# Patient Record
Sex: Female | Born: 1980
Health system: Southern US, Community
[De-identification: ages and names within clinical notes are randomized; demographics above are authoritative.]

---

## 2016-01-26 DIAGNOSIS — K2900 Acute gastritis without bleeding: Secondary | ICD-10-CM | POA: Diagnosis not present

## 2016-01-26 DIAGNOSIS — R197 Diarrhea, unspecified: Secondary | ICD-10-CM | POA: Diagnosis not present

## 2016-01-26 DIAGNOSIS — D1803 Hemangioma of intra-abdominal structures: Secondary | ICD-10-CM | POA: Diagnosis not present

## 2016-01-26 DIAGNOSIS — R1013 Epigastric pain: Secondary | ICD-10-CM | POA: Diagnosis not present

## 2016-01-26 DIAGNOSIS — R112 Nausea with vomiting, unspecified: Secondary | ICD-10-CM | POA: Diagnosis not present

## 2016-02-03 DIAGNOSIS — Z0000 Encounter for general adult medical examination without abnormal findings: Secondary | ICD-10-CM | POA: Diagnosis not present

## 2016-02-03 DIAGNOSIS — R002 Palpitations: Secondary | ICD-10-CM | POA: Diagnosis not present

## 2016-02-03 DIAGNOSIS — Z1151 Encounter for screening for human papillomavirus (HPV): Secondary | ICD-10-CM | POA: Diagnosis not present

## 2016-07-05 DIAGNOSIS — Z309 Encounter for contraceptive management, unspecified: Secondary | ICD-10-CM | POA: Diagnosis not present

## 2016-07-05 DIAGNOSIS — Z01419 Encounter for gynecological examination (general) (routine) without abnormal findings: Secondary | ICD-10-CM | POA: Diagnosis not present

## 2016-07-05 DIAGNOSIS — Z0000 Encounter for general adult medical examination without abnormal findings: Secondary | ICD-10-CM | POA: Diagnosis not present

## 2016-07-05 DIAGNOSIS — Z113 Encounter for screening for infections with a predominantly sexual mode of transmission: Secondary | ICD-10-CM | POA: Diagnosis not present

## 2016-08-02 DIAGNOSIS — K080 Exfoliation of teeth due to systemic causes: Secondary | ICD-10-CM | POA: Diagnosis not present

## 2017-05-01 DIAGNOSIS — K080 Exfoliation of teeth due to systemic causes: Secondary | ICD-10-CM | POA: Diagnosis not present

## 2017-05-24 DIAGNOSIS — K080 Exfoliation of teeth due to systemic causes: Secondary | ICD-10-CM | POA: Diagnosis not present

## 2017-06-07 ENCOUNTER — Encounter: Payer: Self-pay | Admitting: Family Medicine

## 2017-06-12 ENCOUNTER — Ambulatory Visit: Payer: Self-pay | Admitting: Family Medicine

## 2017-06-15 ENCOUNTER — Ambulatory Visit (INDEPENDENT_AMBULATORY_CARE_PROVIDER_SITE_OTHER): Payer: Federal, State, Local not specified - PPO | Admitting: Family Medicine

## 2017-06-15 ENCOUNTER — Telehealth: Payer: Self-pay | Admitting: Family Medicine

## 2017-06-15 ENCOUNTER — Encounter: Payer: Self-pay | Admitting: Family Medicine

## 2017-06-15 VITALS — BP 104/69 | HR 60 | Temp 97.8°F | Resp 18 | Ht 64.17 in | Wt 130.6 lb

## 2017-06-15 DIAGNOSIS — Z30019 Encounter for initial prescription of contraceptives, unspecified: Secondary | ICD-10-CM | POA: Diagnosis not present

## 2017-06-15 DIAGNOSIS — L739 Follicular disorder, unspecified: Secondary | ICD-10-CM | POA: Diagnosis not present

## 2017-06-15 DIAGNOSIS — L858 Other specified epidermal thickening: Secondary | ICD-10-CM | POA: Diagnosis not present

## 2017-06-15 LAB — POCT URINE PREGNANCY: Preg Test, Ur: NEGATIVE

## 2017-06-15 MED ORDER — CLINDAMYCIN PHOSPHATE 1 % EX SOLN: Freq: Two times a day (BID) | CUTANEOUS | 5 refills | Status: DC

## 2017-06-15 NOTE — Telephone Encounter (Signed)
Please let patient know that I need to see her again to complete paperwork for her IUD, sorry I was not aware of this process. Please schedule.

## 2017-06-15 NOTE — Patient Instructions (Signed)
     IF you received an x-ray today, you will receive an invoice from [facility]. Please contact [facility] at [phone] with questions or concerns regarding your invoice.   IF you received labwork today, you will receive an invoice from [facility]. Please contact [facility] at [phone] with questions or concerns regarding your invoice.   Our billing staff will not be able to assist you with questions regarding bills from these companies.  You will be contacted with the lab results as soon as they are available. The fastest way to get your results is to activate your My Chart account. Instructions are located on the last page of this paperwork. If you have not heard from us regarding the results in 2 weeks, please contact this office.     

## 2017-06-15 NOTE — Progress Notes (Signed)
9/20/20183:31 PM  Courtney Dudley [birth year], 36 y.o. female [id number]  Chief Complaint  Patient presents with  . Rash    on both arms has been coming and going for a year  . Contraception    HPI:   Patient is a 36 y.o. female with no significant past medical history who presents today for rash on extensor surface of both arms for past year. It started when she moved to [facility]. Was rx topical abx, helped but caused skin dryness, so she stopped. She has recently started applying vitamin E otherwise she has not done anything else for it.  She is also wanting to discuss birth control. In the past got pregnant on the patch. Yaz caused breakthrough bleeding. Has not tried anything else. She has 3 children and does not want anymore at this time. She reports irregular heavy menses. Currently using condoms.  Depression screen PHQ 2/9 06/15/2017  Decreased Interest 0  Down, Depressed, Hopeless 0  PHQ - 2 Score 0    No Known Allergies  No current outpatient prescriptions on file prior to visit.   No current facility-administered medications on file prior to visit.     History reviewed. No pertinent past medical history.  History reviewed. No pertinent surgical history.  Social History  Substance Use Topics  . Smoking status: Never Smoker  . Smokeless tobacco: Never Used  . Alcohol use Not on file    History reviewed. No pertinent family history.  Review of Systems  Constitutional: Negative for chills and fever.  Respiratory: Negative for cough and shortness of breath.   Cardiovascular: Negative for chest pain, palpitations and leg swelling.  Gastrointestinal: Negative for abdominal pain, nausea and vomiting.  Skin: Positive for rash. Negative for itching.     OBJECTIVE:  Blood pressure 104/69, pulse 60, temperature 97.8 F (36.6 C), temperature source Oral, resp. rate 18, height 5' 4.17" (1.63 m), weight 130 lb 9.6 oz (59.2 kg), last menstrual period 05/15/2017, SpO2 98  %.  Physical Exam  Constitutional: She is oriented to person, place, and time and well-developed, well-nourished, and in no distress.  HENT:  Head: Normocephalic and atraumatic.  Mouth/Throat: Oropharynx is clear and moist. No oropharyngeal exudate.  Eyes: Pupils are equal, round, and reactive to light. EOM are normal. No scleral icterus.  Neck: Neck supple.  Cardiovascular: Normal rate, regular rhythm and normal heart sounds.  Exam reveals no gallop and no friction rub.   No murmur heard. Pulmonary/Chest: Effort normal and breath sounds normal. She has no wheezes. She has no rales.  Musculoskeletal: She exhibits no edema.  Neurological: She is alert and oriented to person, place, and time. Gait normal.  Skin: Skin is warm and dry. Rash (extensor surface of arms with scattered hyperpigmentation, rough bumps, some follicles with pustules) noted.    Results for orders placed or performed in visit on 06/15/17  POCT urine pregnancy  Result Value Ref Range   Preg Test, Ur Negative Negative     ASSESSMENT and PLAN:  1. Encounter for initial prescription of contraceptives, unspecified contraceptive Discussed birth control options with focus on IUD given current irregular heavy menses. Provided patient educational handouts. - POCT urine pregnancy  2. Folliculitis Discussed skin findings, reassured benign and non contagious. Also focused on management vs resolution of skin. Discussed conservative measures, rx for topical clindamycin  3. Keratosis pilaris See above. Advised use of amlactin or similar. Patient educational handout given.        Myles Lipps, MD  Primary Care at [place], [place] [address] Ph.  [phone] Fax [phone]

## 2017-06-16 ENCOUNTER — Telehealth: Payer: Self-pay | Admitting: Family Medicine

## 2017-06-16 NOTE — Telephone Encounter (Signed)
Spoke with patient advised her to return back to clinic for an OV to finish her paperwork for an IUD

## 2017-06-22 ENCOUNTER — Encounter: Payer: Federal, State, Local not specified - PPO | Admitting: Family Medicine

## 2017-08-01 ENCOUNTER — Encounter: Payer: Self-pay | Admitting: Family Medicine

## 2017-08-01 ENCOUNTER — Ambulatory Visit (INDEPENDENT_AMBULATORY_CARE_PROVIDER_SITE_OTHER): Payer: Federal, State, Local not specified - PPO | Admitting: Family Medicine

## 2017-08-01 ENCOUNTER — Other Ambulatory Visit: Payer: Self-pay | Admitting: Family Medicine

## 2017-08-01 VITALS — BP 100/60 | HR 80 | Temp 97.6°F | Resp 16 | Ht 65.5 in | Wt 126.4 lb

## 2017-08-01 DIAGNOSIS — Z30014 Encounter for initial prescription of intrauterine contraceptive device: Secondary | ICD-10-CM | POA: Diagnosis not present

## 2017-08-01 DIAGNOSIS — Z01419 Encounter for gynecological examination (general) (routine) without abnormal findings: Secondary | ICD-10-CM

## 2017-08-01 DIAGNOSIS — Z23 Encounter for immunization: Secondary | ICD-10-CM

## 2017-08-01 NOTE — Patient Instructions (Signed)
1. Buy an over the counter glucometer kit with about 10 strips, check glucose level when feeling symptoms of hunger and weakness, note when you last ate and what it was.

## 2017-08-01 NOTE — Progress Notes (Signed)
11/6/20185:25 PM  Courtney Dudley [birth year], 36 y.o. female [id number]  Chief Complaint  Patient presents with  . Annual Exam    with pap    HPI:   Patient is a 36 y.o. female with no significant past medical history who presents today for CPE.  Patient is a [id number] LMP 07/23/17, irregular, heavy menses Last visit we dicussed BC options, she would like to pursue Mirena IUD She otherwise states that a topical KP lotion has been working well She reports recent episodes of feeling very weak and hungry, she is worried as there a strong fhx diabetes. She eats very healthy, lean protein, nuts, does eat rice every day but no other significant source of carbs. She eats dinner at 430pm, tends to happen around 6pm.  She is fasting today Denies any weight loss, polydipsia, polyuria.  Depression screen [facility] 2/9 08/01/2017 06/15/2017  Decreased Interest 0 0  Down, Depressed, Hopeless 0 0  PHQ - 2 Score 0 0    No Known Allergies  Prior to Admission medications   Medication Sig Start Date End Date Taking? Authorizing Provider    History reviewed. No pertinent past medical history.  History reviewed. No pertinent surgical history.  Social History   Tobacco Use  . Smoking status: Never Smoker  . Smokeless tobacco: Never Used  Substance Use Topics  . Alcohol use: No    Frequency: Never    History reviewed. No pertinent family history.  Review of Systems  Constitutional: Negative for chills and fever.  Respiratory: Negative for cough and shortness of breath.   Cardiovascular: Negative for chest pain, palpitations and leg swelling.  Gastrointestinal: Negative for abdominal pain, constipation, diarrhea, nausea and vomiting.  Genitourinary:       Neg breast lumps or nipple discharge Neg vaginal discharge, pelvic pain, dyspareunia, abnormal vaginal bleeding  Musculoskeletal: Negative for joint pain and myalgias.  Neurological: Positive for dizziness, weakness and headaches.    Endo/Heme/Allergies: Negative for polydipsia.   OBJECTIVE:  Blood pressure 100/60, pulse 80, temperature 97.6 F (36.4 C), temperature source Oral, resp. rate 16, height 5' 5.5" (1.664 m), weight 126 lb 6.4 oz (57.3 kg), last menstrual period 07/23/2017, SpO2 98 %.  Physical Exam  Constitutional: She is oriented to person, place, and time and well-developed, well-nourished, and in no distress.  HENT:  Head: Normocephalic and atraumatic.  Right Ear: Hearing, tympanic membrane, external ear and ear canal normal.  Left Ear: Hearing, tympanic membrane, external ear and ear canal normal.  Mouth/Throat: Oropharynx is clear and moist.  Eyes: EOM are normal. Pupils are equal, round, and reactive to light.  Neck: Neck supple. No thyromegaly present.  Cardiovascular: Normal rate, regular rhythm, normal heart sounds and intact distal pulses. Exam reveals no gallop and no friction rub.  No murmur heard. Pulmonary/Chest: Effort normal and breath sounds normal. She has no wheezes. She has no rales. Right breast exhibits no inverted nipple, no mass, no nipple discharge, no skin change and no tenderness. Left breast exhibits no inverted nipple, no mass, no nipple discharge, no skin change and no tenderness.  Abdominal: Soft. Bowel sounds are normal. She exhibits no distension and no mass. There is no tenderness.  Genitourinary: Vagina normal, uterus normal, right adnexa normal, left adnexa normal and vulva normal.  Musculoskeletal: Normal range of motion. She exhibits no edema.  Lymphadenopathy:    She has no cervical adenopathy.    She has no axillary adenopathy.       Right: No supraclavicular adenopathy  present.       Left: No supraclavicular adenopathy present.  Neurological: She is alert and oriented to person, place, and time. She has normal reflexes. Gait normal.  Skin: Skin is warm and dry.  Psychiatric: Mood and affect normal.  Nursing note and vitals reviewed.   ASSESSMENT and PLAN  1.  Encounter for annual routine gynecological examination  Routine HCM labs ordered. HCM reviewed/discussed. Anticipatory guidance regarding healthy weight, lifestyle and choices given.   Discussed spacing of meals, protein and fiber to help stabilize, asked that patient check cbgs at home with OTC glucometer when having sx.   - CBC - Lipid panel - TSH - CMP14+EGFR - Pap IG w/ reflex to HPV when ASC-U  2. Need for prophylactic vaccination and inoculation against influenza - Flu Vaccine QUAD 36+ mos IM - given today - Tdap vaccine greater than or equal to 7yo IM - given today  3. Encounter for initial prescription of intrauterine contraceptive device (IUD) R/S/B discussed. Paperwork to order [facility] from [facility] completed today. Patient to return while on next menses for insertion.     Return in about 4 weeks (around 08/29/2017) for IUD placement, fu possible hypoglycemia.    Rutherford Guys, MD Primary Care at [facility] [place], [place] [address] Ph.  [phone] Fax [phone]

## 2017-08-02 LAB — CMP14+EGFR
ALT: 12 IU/L (ref 0–32)
AST: 16 IU/L (ref 0–40)
Albumin/Globulin Ratio: 2.2 (ref 1.2–2.2)
Albumin: 4.7 g/dL (ref 3.5–5.5)
Alkaline Phosphatase: 39 IU/L (ref 39–117)
BUN/Creatinine Ratio: 13 (ref 9–23)
BUN: 11 mg/dL (ref 6–20)
Bilirubin Total: 0.4 mg/dL (ref 0.0–1.2)
CO2: 25 mmol/L (ref 20–29)
Calcium: 9.6 mg/dL (ref 8.7–10.2)
Chloride: 101 mmol/L (ref 96–106)
Creatinine, Ser: 0.83 mg/dL (ref 0.57–1.00)
GFR calc Af Amer: 105 mL/min/{1.73_m2} (ref >59)
GFR calc non Af Amer: 91 mL/min/{1.73_m2} (ref >59)
Globulin, Total: 2.1 g/dL (ref 1.5–4.5)
Glucose: 84 mg/dL (ref 65–99)
Potassium: 4.5 mmol/L (ref 3.5–5.2)
Sodium: 139 mmol/L (ref 134–144)
Total Protein: 6.8 g/dL (ref 6.0–8.5)

## 2017-08-02 LAB — PAP IG W/ RFLX HPV ASCU: PAP Smear Comment: 0

## 2017-08-02 LAB — CBC
Hematocrit: 42.8 % (ref 34.0–46.6)
Hemoglobin: 13.9 g/dL (ref 11.1–15.9)
MCH: 30.4 pg (ref 26.6–33.0)
MCHC: 32.5 g/dL (ref 31.5–35.7)
MCV: 94 fL (ref 79–97)
Platelets: 242 10*3/uL (ref 150–379)
RBC: 4.57 x10E6/uL (ref 3.77–5.28)
RDW: 14.1 % (ref 12.3–15.4)
WBC: 4.9 10*3/uL (ref 3.4–10.8)

## 2017-08-02 LAB — LIPID PANEL
Chol/HDL Ratio: 2.9 ratio (ref 0.0–4.4)
Cholesterol, Total: 238 mg/dL — ABNORMAL HIGH (ref 100–199)
HDL: 81 mg/dL (ref >39)
LDL Calculated: 148 mg/dL — ABNORMAL HIGH (ref 0–99)
Triglycerides: 43 mg/dL (ref 0–149)
VLDL Cholesterol Cal: 9 mg/dL (ref 5–40)

## 2017-08-02 LAB — TSH: TSH: 1.58 u[IU]/mL (ref 0.450–4.500)

## 2017-08-02 LAB — SPECIMEN STATUS REPORT

## 2017-08-04 ENCOUNTER — Encounter: Payer: Self-pay | Admitting: Family Medicine

## 2017-08-09 ENCOUNTER — Telehealth: Payer: Self-pay | Admitting: Family Medicine

## 2017-08-09 NOTE — Telephone Encounter (Signed)
Pt calling to get results from labs done a week ago. Notified pt that the results had not been released for the [facility] triage nurse to disclose and that she would be notified by the office when results are ready.

## 2017-08-24 ENCOUNTER — Telehealth: Payer: Self-pay | Admitting: Family Medicine

## 2017-08-24 NOTE — Telephone Encounter (Signed)
See routing message

## 2017-08-25 NOTE — Telephone Encounter (Signed)
I do not have this form.  Please advise [facility] to resend

## 2017-08-25 NOTE — Telephone Encounter (Signed)
[facility] He states fax requesting IUD was never received, requesting fax be re-sent to [phone].

## 2017-09-08 ENCOUNTER — Encounter: Payer: Self-pay | Admitting: Family Medicine

## 2017-09-08 ENCOUNTER — Ambulatory Visit: Payer: Federal, State, Local not specified - PPO | Admitting: Family Medicine

## 2017-09-08 ENCOUNTER — Other Ambulatory Visit: Payer: Self-pay

## 2017-09-08 VITALS — BP 120/76 | HR 71 | Temp 97.7°F | Resp 18 | Ht 64.65 in | Wt 128.8 lb

## 2017-09-08 DIAGNOSIS — N926 Irregular menstruation, unspecified: Secondary | ICD-10-CM | POA: Diagnosis not present

## 2017-09-08 DIAGNOSIS — M2570 Osteophyte, unspecified joint: Secondary | ICD-10-CM

## 2017-09-08 LAB — POCT URINE PREGNANCY: Preg Test, Ur: NEGATIVE

## 2017-09-08 NOTE — Progress Notes (Signed)
   12/14/20184:33 PM  Courtney Dudley 04/09/1981, 36 y.o. female [id number]  Chief Complaint  Patient presents with  . Mass    X 1 day- knot/bump on back of head     HPI:   Patient is a 36 y.o. female who presents today for she felt a bump yesterday when combing her hair. She states that it is not painful, she has never noticed it before, she denies any trauma to area.   Also requesting to check for pregnancy as she is late menses wise. Still pending arrival of mirena IUD, declines any transitioning form of contraception.  [facility] 09/08/2017 08/01/2017 06/15/2017  Decreased Interest 0 0 0  Down, Depressed, Hopeless 0 0 0  PHQ - 2 Score 0 0 0    No Known Allergies  Prior to Admission medications   Medication Sig Start Date End Date Taking? Authorizing Provider  clindamycin (CLEOCIN-T) 1 % external solution Apply topically 2 (two) times daily. 06/15/17  Yes Myles LippsSantiago, Irma M, MD    History reviewed. No pertinent past medical history.  History reviewed. No pertinent surgical history.  Social History   Tobacco Use  . Smoking status: Never Smoker  . Smokeless tobacco: Never Used  Substance Use Topics  . Alcohol use: No    Frequency: Never    History reviewed. No pertinent family history.  Review of Systems  Constitutional: Negative for chills, diaphoresis, fever, malaise/fatigue and weight loss.  HENT: Negative for hearing loss and tinnitus.   Eyes: Negative for blurred vision and double vision.  Neurological: Positive for dizziness (had vertigo several weeks ago, to the left, resolved). Negative for headaches.     OBJECTIVE:  Blood pressure 120/76, pulse 71, temperature 97.7 F (36.5 C), temperature source Oral, resp. rate 18, height 5' 4.65" (1.642 m), weight 128 lb 12.8 oz (58.4 kg), last menstrual period 07/28/2017, SpO2 98 %.   Physical Exam  Constitutional: She is oriented to person, place, and time and well-developed, well-nourished, and in  no distress.  HENT:  Head: Normocephalic and atraumatic.    Mouth/Throat: Mucous membranes are normal.  Eyes: EOM are normal. Pupils are equal, round, and reactive to light. No scleral icterus.  Neck: Neck supple.  Pulmonary/Chest: Effort normal.  Neurological: She is alert and oriented to person, place, and time. Gait normal.  Skin: Skin is warm and dry.  Psychiatric: Mood and affect normal.  Nursing note and vitals reviewed.      Results for orders placed or performed in visit on 09/08/17 (from the past 24 hour(s))  POCT urine pregnancy     Status: None   Collection Time: 09/08/17  4:25 PM  Result Value Ref Range   Preg Test, Ur Negative Negative    No results found.   ASSESSMENT and PLAN  1. Missed period Paperwork for Mirena IUD refaxed today. Declines bridge method.  - POCT urine pregnancy  2. Bone callus Non concerning exam. Will continue to monitor.   Return in about 3 weeks (around 09/29/2017) for IUD.    Myles LippsIrma M Santiago, MD Primary [facility] at [facility] [address] [place], [place] [id number] Ph.  [phone] Fax [phone]

## 2017-09-08 NOTE — Patient Instructions (Signed)
     IF you received an x-ray today, you will receive an invoice from [facility]. Please contact [facility] at [phone] with questions or concerns regarding your invoice.   IF you received labwork today, you will receive an invoice from [facility]. Please contact [facility] at [phone] with questions or concerns regarding your invoice.   Our billing staff will not be able to assist you with questions regarding bills from these companies.  You will be contacted with the lab results as soon as they are available. The fastest way to get your results is to activate your My Chart account. Instructions are located on the last page of this paperwork. If you have not heard from us regarding the results in 2 weeks, please contact this office.     

## 2017-09-09 ENCOUNTER — Ambulatory Visit: Payer: Federal, State, Local not specified - PPO | Admitting: Family Medicine

## 2017-10-06 ENCOUNTER — Telehealth: Payer: Self-pay

## 2017-10-06 NOTE — Telephone Encounter (Signed)
Order for IUD was made 10/06/17 Pharmacy - [facility] [phone]

## 2018-01-05 ENCOUNTER — Telehealth: Payer: Self-pay

## 2018-01-05 NOTE — Telephone Encounter (Signed)
L/m for pt to update on status of Mirena.  Have not received it from supplier yet.  If she is still interested, we could confirm with insurance and order it from another supplier but need to confirm if she still wants it.  Requested c/b.

## 2018-01-08 DIAGNOSIS — K080 Exfoliation of teeth due to systemic causes: Secondary | ICD-10-CM | POA: Diagnosis not present

## 2018-02-07 DIAGNOSIS — K080 Exfoliation of teeth due to systemic causes: Secondary | ICD-10-CM | POA: Diagnosis not present

## 2018-02-26 DIAGNOSIS — Z0000 Encounter for general adult medical examination without abnormal findings: Secondary | ICD-10-CM | POA: Diagnosis not present

## 2018-02-26 DIAGNOSIS — E7800 Pure hypercholesterolemia, unspecified: Secondary | ICD-10-CM | POA: Diagnosis not present

## 2018-02-26 DIAGNOSIS — L309 Dermatitis, unspecified: Secondary | ICD-10-CM | POA: Diagnosis not present

## 2018-03-06 DIAGNOSIS — E7800 Pure hypercholesterolemia, unspecified: Secondary | ICD-10-CM | POA: Diagnosis not present

## 2018-11-27 DIAGNOSIS — K080 Exfoliation of teeth due to systemic causes: Secondary | ICD-10-CM | POA: Diagnosis not present

## 2019-05-15 DIAGNOSIS — M79604 Pain in right leg: Secondary | ICD-10-CM | POA: Diagnosis not present

## 2019-05-15 DIAGNOSIS — M79605 Pain in left leg: Secondary | ICD-10-CM | POA: Diagnosis not present

## 2019-08-19 DIAGNOSIS — K080 Exfoliation of teeth due to systemic causes: Secondary | ICD-10-CM | POA: Diagnosis not present

## 2019-10-20 DIAGNOSIS — Z20828 Contact with and (suspected) exposure to other viral communicable diseases: Secondary | ICD-10-CM | POA: Diagnosis not present

## 2019-10-20 DIAGNOSIS — Z20822 Contact with and (suspected) exposure to covid-19: Secondary | ICD-10-CM | POA: Diagnosis not present

## 2020-01-20 ENCOUNTER — Emergency Department (HOSPITAL_COMMUNITY)
Admission: EM | Admit: 2020-01-20 | Discharge: 2020-01-20 | Disposition: A | Discharge status: Home / Self Care | Payer: Federal, State, Local not specified - PPO | Attending: Emergency Medicine | Admitting: Emergency Medicine

## 2020-01-20 ENCOUNTER — Other Ambulatory Visit: Payer: Self-pay

## 2020-01-20 ENCOUNTER — Emergency Department (HOSPITAL_COMMUNITY): Payer: Federal, State, Local not specified - PPO

## 2020-01-20 DIAGNOSIS — H81399 Other peripheral vertigo, unspecified ear: Secondary | ICD-10-CM | POA: Diagnosis not present

## 2020-01-20 DIAGNOSIS — Z79899 Other long term (current) drug therapy: Secondary | ICD-10-CM | POA: Insufficient documentation

## 2020-01-20 DIAGNOSIS — R42 Dizziness and giddiness: Secondary | ICD-10-CM | POA: Diagnosis not present

## 2020-01-20 LAB — I-STAT BETA HCG BLOOD, ED (MC, WL, AP ONLY): I-stat hCG, quantitative: <5 m[IU]/mL (ref <5)

## 2020-01-20 LAB — BASIC METABOLIC PANEL
Anion gap: 7 (ref 5–15)
BUN: 9 mg/dL (ref 6–20)
CO2: 26 mmol/L (ref 22–32)
Calcium: 8.8 mg/dL — ABNORMAL LOW (ref 8.9–10.3)
Chloride: 107 mmol/L (ref 98–111)
Creatinine, Ser: 0.75 mg/dL (ref 0.44–1.00)
GFR calc Af Amer: >60 mL/min (ref >60)
GFR calc non Af Amer: >60 mL/min (ref >60)
Glucose, Bld: 96 mg/dL (ref 70–99)
Potassium: 3.6 mmol/L (ref 3.5–5.1)
Sodium: 140 mmol/L (ref 135–145)

## 2020-01-20 LAB — CBC
HCT: 37.9 % (ref 36.0–46.0)
Hemoglobin: 12.4 g/dL (ref 12.0–15.0)
MCH: 30.2 pg (ref 26.0–34.0)
MCHC: 32.7 g/dL (ref 30.0–36.0)
MCV: 92.4 fL (ref 80.0–100.0)
Platelets: 172 10*3/uL (ref 150–400)
RBC: 4.1 MIL/uL (ref 3.87–5.11)
RDW: 12.7 % (ref 11.5–15.5)
WBC: 4.1 10*3/uL (ref 4.0–10.5)
nRBC: 0 % (ref 0.0–0.2)

## 2020-01-20 LAB — CBG MONITORING, ED: Glucose-Capillary: 81 mg/dL (ref 70–99)

## 2020-01-20 MED ORDER — DIAZEPAM 2 MG PO TABS
2.0000 mg | ORAL_TABLET | Freq: Once | ORAL | Status: AC
  Administered 2020-01-20: 2 mg via ORAL
  Filled 2020-01-20: qty 1

## 2020-01-20 MED ORDER — SODIUM CHLORIDE 0.9% FLUSH: 3.0000 mL | Freq: Once | INTRAVENOUS | Status: DC

## 2020-01-20 MED ORDER — MECLIZINE HCL 25 MG PO TABS
25.0000 mg | ORAL_TABLET | Freq: Once | ORAL | Status: AC
  Administered 2020-01-20: 25 mg via ORAL
  Filled 2020-01-20: qty 1

## 2020-01-20 MED ORDER — MECLIZINE HCL 25 MG PO TABS
25.0000 mg | ORAL_TABLET | Freq: Three times a day (TID) | ORAL | 0 refills | Status: DC | PRN
Start: 2020-01-20 — End: 2020-05-13

## 2020-01-20 NOTE — ED Provider Notes (Signed)
[facility] EMERGENCY DEPARTMENT Provider Note   CSN: [id number] Arrival date & time: 01/20/20  9381     History Chief Complaint  Patient presents with  . Dizziness    Courtney Dudley is a 39 y.o. female history of migraines otherwise healthy.  Patient reports that on Saturday morning 01/18/2020 she was sleeping on her right side when she woke up, she then turned onto her back and experienced dizziness which she describes as room spinning, this lasted several seconds, she attempted to sit up straight in bed became lightheaded and had to lay back down, she denies loss of consciousness.  She reports that she has had intermittent room spinning sensation since that time particularly whenever she is laying down and turns her head to the side, this occurs less often when she is already standing up.  Associated symptom headache, she reports that she has a history of menstrual headaches.  She reports that on Saturday morning she had a mild headache which has since resolved she described as a bilateral throbbing sensation nonradiating consistent with her normal menstrual headache.  Denies recent illness, fever/chills, vision changes, difficulty speaking, sore throat, neck stiffness, chest pain/shortness of breath, cough/hemoptysis, abdominal pain, vomiting/diarrhea, dysuria, numbness/weakness, tingling or any additional concerns.  Additionally denies any recent chiropractic adjustments or potential maneuvers that could lead to injury of the cervical spine.  HPI     No past medical history on file.  There are no problems to display for this patient.   No past surgical history on file.   OB History   No obstetric history on file.     No family history on file.  Social History   Tobacco Use  . Smoking status: Never Smoker  . Smokeless tobacco: Never Used  Substance Use Topics  . Alcohol use: No  . Drug use: No    Home Medications Prior to Admission medications    Medication Sig Start Date End Date Taking? Authorizing Provider  clindamycin (CLEOCIN-T) 1 % external solution Apply topically 2 (two) times daily. 06/15/17   Rutherford Guys, MD  meclizine (ANTIVERT) 25 MG tablet Take 1 tablet (25 mg total) by mouth 3 (three) times daily as needed for dizziness. 01/20/20   Deliah Boston, PA-C    Allergies    Patient has no known allergies.  Review of Systems   Review of Systems Ten systems are reviewed and are negative for acute change except as noted in the HPI  Physical Exam Updated Vital Signs BP 124/75 (BP Location: Left Arm)   Pulse (!) 56   Temp 98 F (36.7 C) (Oral)   Resp 16   LMP 01/20/2020   SpO2 100%   Physical Exam Constitutional:      General: She is not in acute distress.    Appearance: Normal appearance. She is well-developed. She is not ill-appearing or diaphoretic.  HENT:     Head: Normocephalic and atraumatic.     Jaw: There is normal jaw occlusion.     Right Ear: External ear normal. No hemotympanum.     Left Ear: External ear normal. No hemotympanum.     Ears:     Comments: Tympanostomy scars    Nose: Nose normal.     Mouth/Throat:     Mouth: Mucous membranes are moist.     Pharynx: Oropharynx is clear.  Eyes:     General: Vision grossly intact. Gaze aligned appropriately.     Conjunctiva/sclera: Conjunctivae normal.  Pupils: Pupils are equal, round, and reactive to light.     Comments: No pain with EOM Visual fields grossly intact bilaterally  Neck:     Trachea: Trachea and phonation normal. No tracheal tenderness or tracheal deviation.     Meningeal: Brudzinski's sign absent.  Pulmonary:     Effort: Pulmonary effort is normal. No respiratory distress.  Abdominal:     General: There is no distension.     Palpations: Abdomen is soft.     Tenderness: There is no abdominal tenderness. There is no guarding or rebound.  Musculoskeletal:        General: Normal range of motion.     Cervical back: Full  passive range of motion without pain and normal range of motion. No spinous process tenderness or muscular tenderness.  Skin:    General: Skin is warm and dry.  Neurological:     Mental Status: She is alert.     GCS: GCS eye subscore is 4. GCS verbal subscore is 5. GCS motor subscore is 6.     Comments: Mental Status: Alert, oriented, thought content appropriate, able to give a coherent history. Speech fluent without evidence of aphasia. Able to follow 2 step commands without difficulty. Cranial Nerves: II: Peripheral visual fields grossly normal, pupils equal, round, reactive to light III,IV, VI: ptosis not present, extra-ocular motions intact bilaterally V,VII: smile symmetric, eyebrows raise symmetric, facial light touch sensation equal VIII: hearing grossly normal to voice X: uvula elevates symmetrically XI: bilateral shoulder shrug symmetric and strong XII: midline tongue extension without fassiculations Motor: Normal tone. 5/5 strength in upper and lower extremities bilaterally including strong and equal grip strength and dorsiflexion/plantar flexion Sensory: Sensation intact to light touch in all extremities.Negative Romberg.  Deep Tendon Reflexes: 2+ patellas Cerebellar: normal finger-to-nose with bilateral upper extremities. Normal heel-to -shin balance bilaterally of the lower extremity. No pronator drift.  Gait: normal gait and balance CV: distal pulses palpable throughout -----      Head impulse test: During a head impulse test, no clear fixation however patient endorsed return of dizziness while turning head.  Nystagmus: unidirectional, horizontal left-beating nystagmus  Skew deviation: grossly absent  Psychiatric:        Behavior: Behavior normal.    ED Results / Procedures / Treatments   Labs (all labs ordered are listed, but only abnormal results are displayed) Labs Reviewed  BASIC METABOLIC PANEL - Abnormal; Notable for the following components:       Result Value   Calcium 8.8 (*)    All other components within normal limits  CBC  URINALYSIS, ROUTINE W REFLEX MICROSCOPIC  CBG MONITORING, ED  I-STAT BETA HCG BLOOD, ED ([facility], [facility], [facility] ONLY)    EKG EKG Interpretation  Date/Time:  Monday January 20 2020 08:31:33 EDT Ventricular Rate:  62 PR Interval:  184 QRS Duration: 88 QT Interval:  416 QTC Calculation: 422 R Axis:   78 Text Interpretation: Normal sinus rhythm Normal ECG no prior available for comparison Confirmed by Tilden Fossa [phone]) on 01/20/2020 12:57:30 PM   Radiology CT Head Wo Contrast  Result Date: 01/20/2020 CLINICAL DATA:  Dizziness and presyncope EXAM: CT HEAD WITHOUT CONTRAST TECHNIQUE: Contiguous axial images were obtained from the base of the skull through the vertex without intravenous contrast. COMPARISON:  None. FINDINGS: Brain: Ventricles and sulci are normal in size and configuration. There is no intracranial mass, hemorrhage, extra-axial fluid collection, or midline shift. The brain parenchyma appears unremarkable. No evident acute infarct. Vascular: Hyperdense vessel.  No evident vascular calcification. Skull: The bony calvarium appears intact. Sinuses/Orbits: Visualized paranasal sinuses are clear. Orbits appear symmetric bilaterally. Other: Mastoid air cells are clear. IMPRESSION: Study within normal limits. Electronically Signed   By: Bretta Bang III M.D.   On: 01/20/2020 14:41    Procedures Procedures (including critical care time)  Medications Ordered in ED Medications  sodium chloride flush (NS) 0.9 % injection 3 mL (3 mLs Intravenous Not Given 01/20/20 1320)  meclizine (ANTIVERT) tablet 25 mg (25 mg Oral Given 01/20/20 1319)  diazepam (VALIUM) tablet 2 mg (2 mg Oral Given 01/20/20 1420)    ED Course  I have reviewed the triage vital signs and the nursing notes.  Pertinent labs & imaging results that were available during my care of the patient were reviewed by me and considered in my medical  decision making (see chart for details).    MDM Rules/Calculators/A&P                     39 year old female presents today with dizziness most consistent with a peripheral cause, likely vertigo. Differential diagnoses includes: BPPV versus labrynthitis.No red flag features for central vertigo to include gradual onset, vertical/bidirectional or nonfatigable nystagmus, focal neurologic abnormalities. Presentation not consistent with an acute CNS infection, vertebral basilar artery insufficiency, dissection, cerebellar hemorrhage or infarction, intracranial mass or bleed, temporal lobe epilepsy, multiple sclerosis, trauma, complex migraine headache. Other acute, emergent causes of vertigo are unlikely given at this time.  Plan of care is meclizine and reassess, patient agreeable to plan. - Patient was given meclizine reports improvement in dizziness but dizziness returns with head movement.  Additionally patient has become very anxious, she was looking at symptoms on her phone and is very concerned of a possible intracranial process.  I had a shared decision-making discussion with the patient, feel patient would benefit from CT head today for assessment of intracranial abnormalities, additionally will give 2 mg p.o. Valium for still likely peripheral vertigo.  Discussed radiation exposures from CT scan patient stated understanding and seemed risks and wishes to proceed with CT scan. = CT Head:  IMPRESSION:  Study within normal limits.   I have personally reviewed patient's CT head, agree with radiology interpretation.  I see no evidence of obvious intracranial hemorrhage or mass-effect. - 3:00 PM: Patient reassessed she is resting comfortably no acute distress reports improvement of dizziness and wishes to go home.  She is updated on findings as above and states understanding.  She will be prescribed meclizine for likely peripheral vertigo and referred to ENT for follow-up delete as well as PCP.   Patient eating [place] sandwich without difficulty.  At this time there does not appear to be any evidence of an acute emergency medical condition and the patient appears stable for discharge with appropriate outpatient follow up. Diagnosis was discussed with patient who verbalizes understanding of care plan and is agreeable to discharge. I have discussed return precautions with patient  who verbalizes understanding. Patient encouraged to follow-up with their PCP and ENT. All questions answered.  Patient's case discussed with Dr. Madilyn Hook who agrees with plan to discharge with follow-up.   Note: Portions of this report may have been transcribed using voice recognition software. Every effort was made to ensure accuracy; however, inadvertent computerized transcription errors may still be present. Final Clinical Impression(s) / ED Diagnoses Final diagnoses:  Dizziness  Peripheral vertigo, unspecified laterality    Rx / DC Orders ED Discharge Orders  Ordered    meclizine (ANTIVERT) 25 MG tablet  3 times daily PRN     01/20/20 1505           Elizabeth Palau 01/20/20 1507    Tilden Fossa, MD 01/20/20 1526

## 2020-01-20 NOTE — Discharge Instructions (Addendum)
You have been diagnosed today with Dizziness, Peripheral Vertigo.  At this time there does not appear to be the presence of an emergent medical condition, however there is always the potential for conditions to change. Please read and follow the below instructions.  Please return to the Emergency Department immediately for any new or worsening symptoms. Please be sure to follow up with your Primary Care Provider within one week regarding your visit today; please call their office to schedule an appointment even if you are feeling better for a follow-up visit. You may take the medication meclizine as prescribed to help with your dizziness.  You may call the specialist at [facility] ENT to schedule a follow-up visit for further evaluation.  Get help right away if: You have trouble moving or talking. You are always dizzy. You pass out (faint). You get very bad headaches. You feel weak in your hands, arms, or legs. You have changes in your hearing. You have changes in how you see (vision). You get a stiff neck. Bright light starts to bother you. You have any new/concerning or worsening of symptoms  Please read the additional information packets attached to your discharge summary.  Do not take your medicine if  develop an itchy rash, swelling in your mouth or lips, or difficulty breathing; call 911 and seek immediate emergency medical attention if this occurs.  Note: Portions of this text may have been transcribed using voice recognition software. Every effort was made to ensure accuracy; however, inadvertent computerized transcription errors may still be present.

## 2020-01-20 NOTE — ED Notes (Signed)
[place] sandwich given 

## 2020-01-20 NOTE — ED Triage Notes (Signed)
Pt sts that when she got up from bed on Saturday she felt lightheaded and like she might pass out. Ever since then, every time she gets up or makes sudden movements with her head, even while lying down, she feels lightheaded. Has not passed out. Endorses pressure in the back of her neck.

## 2020-01-22 DIAGNOSIS — G43809 Other migraine, not intractable, without status migrainosus: Secondary | ICD-10-CM | POA: Diagnosis not present

## 2020-02-19 ENCOUNTER — Other Ambulatory Visit (HOSPITAL_COMMUNITY)
Admission: RE | Admit: 2020-02-19 | Discharge: 2020-02-19 | Disposition: A | Discharge status: Home / Self Care | Payer: Federal, State, Local not specified - PPO | Source: Ambulatory Visit | Attending: Family Medicine | Admitting: Family Medicine

## 2020-02-19 DIAGNOSIS — Z13220 Encounter for screening for lipoid disorders: Secondary | ICD-10-CM | POA: Diagnosis not present

## 2020-02-19 DIAGNOSIS — Z0000 Encounter for general adult medical examination without abnormal findings: Secondary | ICD-10-CM | POA: Diagnosis not present

## 2020-02-19 DIAGNOSIS — Z01411 Encounter for gynecological examination (general) (routine) with abnormal findings: Secondary | ICD-10-CM | POA: Diagnosis not present

## 2020-02-19 DIAGNOSIS — G43809 Other migraine, not intractable, without status migrainosus: Secondary | ICD-10-CM | POA: Diagnosis not present

## 2020-02-19 DIAGNOSIS — E7800 Pure hypercholesterolemia, unspecified: Secondary | ICD-10-CM | POA: Diagnosis not present

## 2020-02-20 LAB — CYTOLOGY - PAP
Comment: NEGATIVE
Diagnosis: NEGATIVE
High risk HPV: NEGATIVE

## 2020-05-13 ENCOUNTER — Emergency Department (HOSPITAL_COMMUNITY)
Admission: EM | Admit: 2020-05-13 | Discharge: 2020-05-13 | Disposition: A | Discharge status: Home / Self Care | Payer: Federal, State, Local not specified - PPO | Attending: Emergency Medicine | Admitting: Emergency Medicine

## 2020-05-13 ENCOUNTER — Emergency Department (HOSPITAL_COMMUNITY): Payer: Federal, State, Local not specified - PPO

## 2020-05-13 ENCOUNTER — Encounter (HOSPITAL_COMMUNITY): Payer: Self-pay | Admitting: Emergency Medicine

## 2020-05-13 ENCOUNTER — Other Ambulatory Visit: Payer: Self-pay

## 2020-05-13 DIAGNOSIS — G43109 Migraine with aura, not intractable, without status migrainosus: Secondary | ICD-10-CM | POA: Diagnosis not present

## 2020-05-13 DIAGNOSIS — R519 Headache, unspecified: Secondary | ICD-10-CM | POA: Diagnosis not present

## 2020-05-13 DIAGNOSIS — G43909 Migraine, unspecified, not intractable, without status migrainosus: Secondary | ICD-10-CM | POA: Diagnosis not present

## 2020-05-13 DIAGNOSIS — R42 Dizziness and giddiness: Secondary | ICD-10-CM | POA: Insufficient documentation

## 2020-05-13 DIAGNOSIS — G43809 Other migraine, not intractable, without status migrainosus: Secondary | ICD-10-CM

## 2020-05-13 DIAGNOSIS — I10 Essential (primary) hypertension: Secondary | ICD-10-CM | POA: Diagnosis not present

## 2020-05-13 DIAGNOSIS — G4489 Other headache syndrome: Secondary | ICD-10-CM | POA: Diagnosis not present

## 2020-05-13 DIAGNOSIS — R52 Pain, unspecified: Secondary | ICD-10-CM | POA: Diagnosis not present

## 2020-05-13 LAB — CBC WITH DIFFERENTIAL/PLATELET
Abs Immature Granulocytes: 0.03 10*3/uL (ref 0.00–0.07)
Basophils Absolute: 0 10*3/uL (ref 0.0–0.1)
Basophils Relative: 1 %
Eosinophils Absolute: 0 10*3/uL (ref 0.0–0.5)
Eosinophils Relative: 0 %
HCT: 38.6 % (ref 36.0–46.0)
Hemoglobin: 13 g/dL (ref 12.0–15.0)
Immature Granulocytes: 1 %
Lymphocytes Relative: 18 %
Lymphs Abs: 0.9 10*3/uL (ref 0.7–4.0)
MCH: 30.8 pg (ref 26.0–34.0)
MCHC: 33.7 g/dL (ref 30.0–36.0)
MCV: 91.5 fL (ref 80.0–100.0)
Monocytes Absolute: 0.3 10*3/uL (ref 0.1–1.0)
Monocytes Relative: 7 %
Neutro Abs: 3.7 10*3/uL (ref 1.7–7.7)
Neutrophils Relative %: 73 %
Platelets: 229 10*3/uL (ref 150–400)
RBC: 4.22 MIL/uL (ref 3.87–5.11)
RDW: 12.3 % (ref 11.5–15.5)
WBC: 5 10*3/uL (ref 4.0–10.5)
nRBC: 0 % (ref 0.0–0.2)

## 2020-05-13 LAB — COMPREHENSIVE METABOLIC PANEL
ALT: 15 U/L (ref 0–44)
AST: 23 U/L (ref 15–41)
Albumin: 4.3 g/dL (ref 3.5–5.0)
Alkaline Phosphatase: 33 U/L — ABNORMAL LOW (ref 38–126)
Anion gap: 9 (ref 5–15)
BUN: 8 mg/dL (ref 6–20)
CO2: 23 mmol/L (ref 22–32)
Calcium: 8.9 mg/dL (ref 8.9–10.3)
Chloride: 104 mmol/L (ref 98–111)
Creatinine, Ser: 0.74 mg/dL (ref 0.44–1.00)
GFR calc Af Amer: >60 mL/min (ref >60)
GFR calc non Af Amer: >60 mL/min (ref >60)
Glucose, Bld: 91 mg/dL (ref 70–99)
Potassium: 3.4 mmol/L — ABNORMAL LOW (ref 3.5–5.1)
Sodium: 136 mmol/L (ref 135–145)
Total Bilirubin: 0.6 mg/dL (ref 0.3–1.2)
Total Protein: 7.1 g/dL (ref 6.5–8.1)

## 2020-05-13 LAB — I-STAT BETA HCG BLOOD, ED (MC, WL, AP ONLY): I-stat hCG, quantitative: <5 m[IU]/mL (ref <5)

## 2020-05-13 MED ORDER — SODIUM CHLORIDE 0.9 % IV BOLUS
1000.0000 mL | Freq: Once | INTRAVENOUS | Status: AC
  Administered 2020-05-13: 1000 mL via INTRAVENOUS

## 2020-05-13 MED ORDER — DIPHENHYDRAMINE HCL 50 MG/ML IJ SOLN
25.0000 mg | Freq: Once | INTRAMUSCULAR | Status: AC
  Administered 2020-05-13: 25 mg via INTRAVENOUS
  Filled 2020-05-13: qty 1

## 2020-05-13 MED ORDER — MECLIZINE HCL 25 MG PO TABS: 25.0000 mg | ORAL_TABLET | Freq: Three times a day (TID) | ORAL | 0 refills | Status: AC | PRN

## 2020-05-13 MED ORDER — MECLIZINE HCL 25 MG PO TABS: 25.0000 mg | ORAL_TABLET | Freq: Once | ORAL | Status: DC

## 2020-05-13 MED ORDER — LORAZEPAM 2 MG/ML IJ SOLN
1.0000 mg | Freq: Once | INTRAMUSCULAR | Status: AC
  Administered 2020-05-13: 1 mg via INTRAVENOUS
  Filled 2020-05-13: qty 1

## 2020-05-13 MED ORDER — METOCLOPRAMIDE HCL 5 MG/ML IJ SOLN
10.0000 mg | Freq: Once | INTRAMUSCULAR | Status: AC
  Administered 2020-05-13: 10 mg via INTRAVENOUS
  Filled 2020-05-13: qty 2

## 2020-05-13 NOTE — Discharge Instructions (Signed)
Stay hydrated.  Take Tylenol or Motrin for headaches.  Take meclizine for dizziness.  Please see your ENT doctor for follow-up.  Return to ER if you have worse dizziness, headaches, vomiting.

## 2020-05-13 NOTE — ED Notes (Signed)
Patient transported to CT 

## 2020-05-13 NOTE — ED Triage Notes (Signed)
Arrives via EMS from home, C/C headache, vertigo and nausea. Recently dx w/ migraines and vertigo. Woke up around 6am, felt dizzy, tried to go to work but too much pain. Lightheadedness with movement, Ems gave 4 mg Zofran.  20 G RAC BP 121/69 O2 98% RA T 97.9

## 2020-05-13 NOTE — ED Provider Notes (Signed)
[facility]-EMERGENCY DEPT Provider Note   CSN: [id number] Arrival date & time: 05/13/20  1525     History Chief Complaint  Patient presents with   Migraine   Dizziness   Nausea    Courtney Dudley is a 39 y.o. female hx of vestibular migraine, here presenting with dizziness, headache.  Patient first started having headaches back in April.  She had a normal head CT at that time.  She was diagnosed with vestibular migraine and saw ENT for follow-up. She was prescribed maxalt as needed. She states that she has been having intermittent headaches and dizziness for the last several weeks.  Patient states that she woke up this morning and the dizziness was worse.  She also has worsening headaches as well.  She states that the room is spinning.  She is nauseated as well.  She did take her Maxalt and meclizine with minimal relief.  She called her ENT doctor and sent here for further management.  Denies any trouble speaking or history of stroke.   The history is provided by the patient.       History reviewed. No pertinent past medical history.  There are no problems to display for this patient.   History reviewed. No pertinent surgical history.   OB History   No obstetric history on file.     No family history on file.  Social History   Tobacco Use   Smoking status: Never Smoker   Smokeless tobacco: Never Used  Vaping Use   Vaping Use: Never used  Substance Use Topics   Alcohol use: No   Drug use: No    Home Medications Prior to Admission medications   Medication Sig Start Date End Date Taking? Authorizing Provider  clindamycin (CLEOCIN-T) 1 % external solution Apply topically 2 (two) times daily. 06/15/17   Myles Lipps, MD  meclizine (ANTIVERT) 25 MG tablet Take 1 tablet (25 mg total) by mouth 3 (three) times daily as needed for dizziness. 01/20/20   Bill Salinas, PA-C    Allergies    Patient has no known allergies.  Review of  Systems   Review of Systems  Neurological: Positive for dizziness.  All other systems reviewed and are negative.   Physical Exam Updated Vital Signs BP 100/63    Pulse 68    Temp 99.6 F (37.6 C) (Oral)    Resp 19    Ht 5\' 5"  (1.651 m)    Wt 59 kg    SpO2 100%    BMI 21.64 kg/m   Physical Exam Vitals and nursing note reviewed.  Constitutional:      Appearance: Normal appearance.  HENT:     Head: Normocephalic.     Nose: Nose normal.     Mouth/Throat:     Mouth: Mucous membranes are moist.  Eyes:     Extraocular Movements: Extraocular movements intact.     Pupils: Pupils are equal, round, and reactive to light.     Comments: No nystagmus   Cardiovascular:     Rate and Rhythm: Normal rate and regular rhythm.     Pulses: Normal pulses.     Heart sounds: Normal heart sounds.  Pulmonary:     Effort: Pulmonary effort is normal.     Breath sounds: Normal breath sounds.  Abdominal:     General: Abdomen is flat.     Palpations: Abdomen is soft.  Musculoskeletal:        General: Normal range of motion.  Cervical back: Normal range of motion.  Skin:    General: Skin is warm.     Capillary Refill: Capillary refill takes less than 2 seconds.  Neurological:     General: No focal deficit present.     Mental Status: She is alert and oriented to person, place, and time.     Cranial Nerves: No cranial nerve deficit.     Comments: No obvious facial droop and cranial nerves II to XII is intact.  Patient has normal sensation bilaterally.  Patient has normal finger-to-nose bilaterally.  Patient has steady gait.  Psychiatric:        Mood and Affect: Mood normal.        Behavior: Behavior normal.     ED Results / Procedures / Treatments   Labs (all labs ordered are listed, but only abnormal results are displayed) Labs Reviewed  COMPREHENSIVE METABOLIC PANEL - Abnormal; Notable for the following components:      Result Value   Potassium 3.4 (*)    Alkaline Phosphatase 33 (*)     All other components within normal limits  CBC WITH DIFFERENTIAL/PLATELET  I-STAT BETA HCG BLOOD, ED ([facility], [facility], [facility] ONLY)    EKG None  Radiology CT Head Wo Contrast  Result Date: 05/13/2020 CLINICAL DATA:  Dizziness, nonspecific. Additional provided: Patient reports headache, vertigo, nausea, recently diagnosed with migraines and vertigo. Patient experiencing lightheadedness with movement EXAM: CT HEAD WITHOUT CONTRAST TECHNIQUE: Contiguous axial images were obtained from the base of the skull through the vertex without intravenous contrast. COMPARISON:  Head CT 01/20/2020 FINDINGS: Brain: Cerebral volume is normal. There is no acute intracranial hemorrhage. No demarcated cortical infarct. No extra-axial fluid collection. No evidence of intracranial mass. No midline shift. Vascular: No hyperdense vessel. Skull: Normal. Negative for fracture or focal lesion. Sinuses/Orbits: Visualized orbits show no acute finding. No significant paranasal sinus disease or mastoid effusion at the imaged levels. IMPRESSION: Unremarkable non-contrast CT appearance of the brain. No evidence of acute intracranial abnormality. Electronically Signed   By: Jackey Loge DO   On: 05/13/2020 17:45    Procedures Procedures (including critical care time)  Medications Ordered in ED Medications  sodium chloride 0.9 % bolus 1,000 mL (1,000 mLs Intravenous New Bag/Given (Non-Interop) 05/13/20 1747)  metoCLOPramide (REGLAN) injection 10 mg (10 mg Intravenous Given 05/13/20 1748)  diphenhydrAMINE (BENADRYL) injection 25 mg (25 mg Intravenous Given 05/13/20 1748)  LORazepam (ATIVAN) injection 1 mg (1 mg Intravenous Given 05/13/20 1748)    ED Course  I have reviewed the triage vital signs and the nursing notes.  Pertinent labs & imaging results that were available during my care of the patient were reviewed by me and considered in my medical decision making (see chart for details).    MDM Rules/Calculators/A&P                           Courtney Dudley is a 39 y.o. female history of vestibular migraines here presenting with dizziness.  Patient has some with changes in her symptoms.  However this is a recurrent problem.  She has no signs of posterior circulation stroke.  She has normal finger-to-nose right now and normal gait.  I think likely recurrent vestibular migraine.  Will get CT head to rule out mass.  Will get CBC and CMP as well.  Will give migraine cocktail and IVF.   7:28 PM CT head and labs unremarkable.  No better after migraine cocktail.  Patient requests  refill for meclizine and encourage her to follow back up with her ENT doctor.  Final Clinical Impression(s) / ED Diagnoses Final diagnoses:  None    Rx / DC Orders ED Discharge Orders    None       Charlynne Pander, MD 05/13/20 1929

## 2020-05-14 DIAGNOSIS — M542 Cervicalgia: Secondary | ICD-10-CM | POA: Diagnosis not present

## 2020-05-14 DIAGNOSIS — H8111 Benign paroxysmal vertigo, right ear: Secondary | ICD-10-CM | POA: Diagnosis not present

## 2020-05-20 DIAGNOSIS — G4489 Other headache syndrome: Secondary | ICD-10-CM | POA: Diagnosis not present

## 2020-05-20 DIAGNOSIS — F430 Acute stress reaction: Secondary | ICD-10-CM | POA: Diagnosis not present

## 2020-05-20 DIAGNOSIS — H6590 Unspecified nonsuppurative otitis media, unspecified ear: Secondary | ICD-10-CM | POA: Diagnosis not present

## 2020-05-20 DIAGNOSIS — H8111 Benign paroxysmal vertigo, right ear: Secondary | ICD-10-CM | POA: Diagnosis not present

## 2020-05-20 DIAGNOSIS — H818X9 Other disorders of vestibular function, unspecified ear: Secondary | ICD-10-CM | POA: Diagnosis not present

## 2020-05-20 DIAGNOSIS — R42 Dizziness and giddiness: Secondary | ICD-10-CM | POA: Diagnosis not present

## 2020-05-25 DIAGNOSIS — G4489 Other headache syndrome: Secondary | ICD-10-CM | POA: Diagnosis not present

## 2020-05-25 DIAGNOSIS — H8111 Benign paroxysmal vertigo, right ear: Secondary | ICD-10-CM | POA: Diagnosis not present

## 2020-05-25 DIAGNOSIS — H818X9 Other disorders of vestibular function, unspecified ear: Secondary | ICD-10-CM | POA: Diagnosis not present

## 2020-05-29 DIAGNOSIS — H6523 Chronic serous otitis media, bilateral: Secondary | ICD-10-CM | POA: Diagnosis not present

## 2020-05-29 DIAGNOSIS — F430 Acute stress reaction: Secondary | ICD-10-CM | POA: Diagnosis not present

## 2020-06-30 DIAGNOSIS — R42 Dizziness and giddiness: Secondary | ICD-10-CM | POA: Diagnosis not present

## 2020-06-30 DIAGNOSIS — H93293 Other abnormal auditory perceptions, bilateral: Secondary | ICD-10-CM | POA: Diagnosis not present

## 2020-07-09 DIAGNOSIS — M79669 Pain in unspecified lower leg: Secondary | ICD-10-CM | POA: Diagnosis not present

## 2020-07-09 DIAGNOSIS — E7800 Pure hypercholesterolemia, unspecified: Secondary | ICD-10-CM | POA: Diagnosis not present

## 2020-07-14 DIAGNOSIS — R42 Dizziness and giddiness: Secondary | ICD-10-CM | POA: Diagnosis not present

## 2020-07-16 DIAGNOSIS — E7800 Pure hypercholesterolemia, unspecified: Secondary | ICD-10-CM | POA: Diagnosis not present

## 2020-07-16 DIAGNOSIS — M79669 Pain in unspecified lower leg: Secondary | ICD-10-CM | POA: Diagnosis not present

## 2020-07-20 DIAGNOSIS — R42 Dizziness and giddiness: Secondary | ICD-10-CM | POA: Diagnosis not present

## 2020-07-31 ENCOUNTER — Other Ambulatory Visit: Payer: Self-pay

## 2020-07-31 ENCOUNTER — Encounter: Payer: Self-pay | Admitting: Physical Therapy

## 2020-07-31 ENCOUNTER — Ambulatory Visit: Payer: Federal, State, Local not specified - PPO | Attending: Otolaryngology | Admitting: Physical Therapy

## 2020-07-31 DIAGNOSIS — H832X9 Labyrinthine dysfunction, unspecified ear: Secondary | ICD-10-CM | POA: Insufficient documentation

## 2020-07-31 DIAGNOSIS — R42 Dizziness and giddiness: Secondary | ICD-10-CM | POA: Insufficient documentation

## 2020-07-31 NOTE — Patient Instructions (Signed)
Access Code: [email or web address] URL: [email or web address] Date: 07/31/2020 Prepared by: Vernon Prey April Kirstie Peri  Exercises Standing Gaze Stabilization with Head Rotation - 1 x daily - 7 x weekly - 3 sets - 1 hold Standing Gaze Stabilization with Head Nod - 1 x daily - 7 x weekly - 3 sets - 1 hold Seated Horizontal Smooth Pursuit - 1 x daily - 7 x weekly - 2 sets - 1 min hold Seated Vertical Smooth Pursuit - 1 x daily - 7 x weekly - 2 sets - 1 min hold Seated Horizontal Saccades - 1 x daily - 7 x weekly - 2 sets - 1 min hold Seated Vertical Saccades - 1 x daily - 7 x weekly - 2 sets - 1 min hold

## 2020-07-31 NOTE — Therapy (Signed)
[facility] [address] [place], [place], [address] Phone: [phone]   Fax:  [phone]  Physical Therapy Evaluation  Patient Details  Name: Courtney Dudley MRN: [id number] Date of Birth: [birth year] No data recorded  Encounter Date: 07/31/2020    History reviewed. No pertinent past medical history.  History reviewed. No pertinent surgical history.  There were no vitals filed for this visit.    Subjective Assessment - 07/31/20 1457    Subjective Pt reports last week or 2 weeks she had a vestibular exam from Dr. Suszanne Dudley and they didn't see anything signficant. She states that he was recommending vestibular physical therapy. Pt did PT at [facility] in August -- she felt better but suddenly it came back again. Pt saw vestibular therapy for 2 visits and was recommended d/c in early October. Pt was given HEP and was told to only do it if she felt dizzy. Pt notes that when she works she isn't comfortable she feels she is gonna fall over. 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 First episode was back in April.    Pertinent History BPPV in April    How long can you sit comfortably? n/a    How long can you stand comfortably? n/a    How long can you walk comfortably? n/a    Diagnostic tests n/a    Patient Stated Goals Resolve her dizziness/unsteadiness    Currently in Pain? No/denies              [facility] PT Assessment - 07/31/20 0001      Ambulation/Gait   Ambulation Distance (Feet) 330 Feet    Assistive device None    Gait Pattern Step-through pattern;Within Functional Limits    Ambulation Surface Level;Indoor    Gait velocity 10.5 sec = 3.1 ft/sec      High Level Balance   High Level Balance Comments mCTSIB: 120; able to maintain 30 sec on solid and compliant surface with eyes open and closed      Functional Gait  Assessment   Gait assessed  Yes    Gait Level Surface Walks 20 ft in less than 5.5 sec, no  assistive devices, good speed, no evidence for imbalance, normal gait pattern, deviates no more than 6 in outside of the 12 in walkway width.    Change in Gait Speed Able to smoothly change walking speed without loss of balance or gait deviation. Deviate no more than 6 in outside of the 12 in walkway width.    Gait with Horizontal Head Turns Performs head turns smoothly with no change in gait. Deviates no more than 6 in outside 12 in walkway width    Gait with Vertical Head Turns Performs head turns with no change in gait. Deviates no more than 6 in outside 12 in walkway width.    Gait and Pivot Turn Pivot turns safely within 3 sec and stops quickly with no loss of balance.    Step Over Obstacle Is able to step over 2 stacked shoe boxes taped together (9 in total height) without changing gait speed. No evidence of imbalance.    Gait with Narrow Base of Support Ambulates 7-9 steps.    Gait with Eyes Closed Walks 20 ft, no assistive devices, good speed, no evidence of imbalance, normal gait pattern, deviates no more than 6 in outside 12 in walkway width. Ambulates 20 ft in less than 7 sec.    Ambulating Backwards Walks 20 ft, no assistive  devices, good speed, no evidence for imbalance, normal gait    Steps Alternating feet, no rail.    Total Score 29                  Vestibular Assessment - 07/31/20 0001      Symptom Behavior   Subjective history of current problem Pt feels she has sinus pressure that's contributing; pt had fluid in her ears and was given prednisone and that helped (she has stopped)    Type of Dizziness  Lightheadedness;Imbalance   "eyes feel like there's pressure"   Frequency of Dizziness Every few days    Duration of Dizziness a few minutes    Symptom Nature Spontaneous    Aggravating Factors Spontaneous onset    Relieving Factors --   Bonine helps a little bit   Progression of Symptoms Worse    History of similar episodes BPPV in April but it hasn't been that bad        Oculomotor Exam   Ocular ROM WFL    Spontaneous Absent    Gaze-induced  Absent    Head shaking Horizontal Absent    Smooth Pursuits Intact    Saccades Intact    Comment HIT: mild corrective saccades with R      Vestibulo-Ocular Reflex   VOR 1 Head Only (x 1 viewing) 2/5 dizziness    VOR 2 Head and Object (x 2 viewing) 0/5 dizziness    VOR to Slow Head Movement Normal    VOR Cancellation Normal              Objective measurements completed on examination: See above findings.               PT Education - 07/31/20 1637    Education Details Discussed exam findings, POC, and HEP initiation. Discussed that to truly stimulate her VOR, she needs to increase the pace of her head turns from her previous vestibular HEP from [facility]. Discussed progressing her HEP into standing or walking if she is not feeling a slight amount of dizziness with the exercises.    Person(s) Educated Patient    Methods Explanation;Demonstration;Tactile cues;Handout    Comprehension Verbalized understanding;Need further instruction            PT Short Term Goals - 07/31/20 1640      PT SHORT TERM GOAL #1   Title Pt will be independent with HEP    Time 2    Period Weeks    Status New    Target Date 08/14/20      PT SHORT TERM GOAL #2   Title Pt will report </=1/5 dizziness with VOR x1 at at least 100 bpm    Time 2    Period Weeks    Status New    Target Date 08/14/20             PT Long Term Goals - 07/31/20 1641      PT LONG TERM GOAL #1   Title Pt will report 0/5 dizziness with all tasks    Time 4    Period Weeks    Status New    Target Date 08/28/20      PT LONG TERM GOAL #2   Title Pt will be independent with self management of her dizziness at home    Time 4    Period Weeks    Status New    Target Date 08/28/20  Plan - 07/31/20 1627    Clinical Impression Statement [name] is a 39 y/o F presenting to OPPT due  continued mild dizziness/unsteadiness. Pt with hx of BPPV and headaches in April -- her BPPV has resolved and initially felt better after participating in vestibular therapy at [facility]; but she now feels a backslide with some continued mild imbalance and dizziness affecting her home and work tasks. On last ENT check up BPPV testing was (-). Pt presents with s/s of residual motion sensitivies and vestibular dysfunction after resolution of her BPPV. Pt c/o suboccipital pressure but otherwise no neck stiffness or limited motion. Pt remains functional in her day to day activities with FGA of 30/30 and has good vestibular integration on mCTSIB. Pt would benefit from continued vestibular PT to continue to return her to her PLOF for improved comfort with home and work tasks.    Personal Factors and Comorbidities Time since onset of injury/illness/exacerbation    Examination-Activity Limitations Locomotion Level;Caring for Others    Examination-Participation Restrictions Occupation    Stability/Clinical Decision Making Stable/Uncomplicated    Clinical Decision Making Low    Rehab Potential Good    PT Frequency 1x / week    PT Duration 4 weeks    PT Treatment/Interventions ADLs/Self Care Home Management;Canalith Repostioning;Gait training;Stair training;Functional mobility training;Therapeutic activities;Therapeutic exercise;Balance training;Neuromuscular re-education;Patient/family education;Manual techniques;Dry needling;Vestibular;Passive range of motion    PT Next Visit Plan How was HEP? Consider cervicogenic dizziness/neck palpation/manual therapy if pt with continued suboccipital pressure. Consider SOT. Progress VOR/gaze stabilization/adaptation/habituation with walking or compliant surfaces.    PT Home Exercise Plan Access Code: [email or web address]    Consulted and Agree with Plan of Care Patient           Patient will benefit from skilled therapeutic intervention in order to improve the following deficits  and impairments:  Decreased activity tolerance, Dizziness, Decreased balance, Difficulty walking, Decreased mobility  Visit Diagnosis: Dizziness and giddiness  Vestibular disequilibrium, unspecified laterality     Problem List There are no problems to display for this patient.   Nattalie Santiesteban April Ma L Mailynn Everly  PT, DPT 07/31/2020, 4:50 PM  [facility] [address] [place], [place], [address] Phone: [phone]   Fax:  [phone]  Name: Courtney Dudley MRN: [id number] Date of Birth: [birth year]

## 2020-08-07 ENCOUNTER — Ambulatory Visit: Payer: Federal, State, Local not specified - PPO

## 2020-08-14 ENCOUNTER — Ambulatory Visit: Payer: Federal, State, Local not specified - PPO

## 2020-08-18 DIAGNOSIS — K080 Exfoliation of teeth due to systemic causes: Secondary | ICD-10-CM | POA: Diagnosis not present

## 2020-08-28 ENCOUNTER — Other Ambulatory Visit: Payer: Self-pay

## 2020-08-28 ENCOUNTER — Ambulatory Visit: Payer: Federal, State, Local not specified - PPO | Attending: Otolaryngology | Admitting: Physical Therapy

## 2020-08-28 DIAGNOSIS — R42 Dizziness and giddiness: Secondary | ICD-10-CM | POA: Insufficient documentation

## 2020-08-28 DIAGNOSIS — H832X9 Labyrinthine dysfunction, unspecified ear: Secondary | ICD-10-CM | POA: Diagnosis not present

## 2020-08-28 NOTE — Therapy (Signed)
[facility] [address] [place], [place], [address] Phone: [phone]   Fax:  [phone]  Physical Therapy Treatment  Patient Details  Name: Courtney Dudley MRN: [id number] Date of Birth: 1981/01/20 No data recorded   PHYSICAL THERAPY DISCHARGE SUMMARY  Visits from Start of Care: 1  Current functional level related to goals / functional outcomes: See below   Remaining deficits: See below   Education / Equipment: See below  Plan: Patient agrees to discharge.  Patient goals were met. Patient is being discharged due to meeting the stated rehab goals.  ?????         Encounter Date: 08/28/2020   PT End of Session - 08/28/20 1613    Visit Number 2    Number of Visits 5    Date for PT Re-Evaluation 08/28/20    Authorization Type BCBS    PT Start Time 1530    PT Stop Time 1610    PT Time Calculation (min) 40 min    Activity Tolerance Patient tolerated treatment well    Behavior During Therapy [facility] for tasks assessed/performed           No past medical history on file.  No past surgical history on file.  There were no vitals filed for this visit.   Subjective Assessment - 08/28/20 1536    Subjective Pt reports she is not having any dizziness. Pt states that she's been doing good; however, she reports pressure/tension behind her eyes. She states she feels it more in the afternoon.    Pertinent History BPPV in April    How long can you sit comfortably? n/a    How long can you stand comfortably? n/a    How long can you walk comfortably? n/a    Diagnostic tests n/a    Patient Stated Goals Resolve her dizziness/unsteadiness    Currently in Pain? No/denies               Manual therapy:  STW TMJ, superior orbitals, and temples  TPR along temples  Massage behind occipitals and temporals     Epley maneuver x 2 to demo for home program              PT Education - 08/28/20 1615     Education Details Reviewed home epley maneuver. Discussed vestibular exercises, discussed massage/manual therapy to address her eye tension/pressure. Discussed with pt at length about BPPV and what to do if she feels the same s/s.    Person(s) Educated Patient    Methods Explanation;Demonstration;Tactile cues    Comprehension Verbalized understanding;Returned demonstration            PT Short Term Goals - 08/28/20 1552      PT SHORT TERM GOAL #1   Title Pt will be independent with HEP    Time 2    Period Weeks    Status Achieved    Target Date 08/14/20      PT SHORT TERM GOAL #2   Title Pt will report </=1/5 dizziness with VOR x1 at at least 100 bpm    Time 2    Period Weeks    Status Achieved    Target Date 08/14/20             PT Long Term Goals - 08/28/20 1553      PT LONG TERM GOAL #1   Title Pt will report 0/5 dizziness with all tasks    Time 4  Period Weeks    Status Achieved      PT LONG TERM GOAL #2   Title Pt will be independent with self management of her dizziness at home    Time 4    Period Weeks    Status Achieved                 Plan - 08/28/20 1617    Clinical Impression Statement Pt returns to clinic with no s/s of dizziness. Pt with c/o ongoing pressure behind her eyes/tension that progresses throughout the day. No LOBs or unsteadiness. PT assessment found pt to have increased facial and head tension -- pt reports improved symptoms after manual therapy, trigger point release and massage. Rest of session focused on pt education for her vestibular therapy and if recurrence of BPPV. Pt has otherwise met all PT goals and is no longer symptomatic.    Personal Factors and Comorbidities Time since onset of injury/illness/exacerbation    Examination-Activity Limitations Locomotion Level;Caring for Others    Examination-Participation Restrictions Occupation    Stability/Clinical Decision Making Stable/Uncomplicated    Rehab Potential Good    PT  Frequency 1x / week    PT Duration 4 weeks    PT Treatment/Interventions ADLs/Self Care Home Management;Canalith Repostioning;Gait training;Stair training;Functional mobility training;Therapeutic activities;Therapeutic exercise;Balance training;Neuromuscular re-education;Patient/family education;Manual techniques;Dry needling;Vestibular;Passive range of motion    PT Next Visit Plan How was HEP? Consider cervicogenic dizziness/neck palpation/manual therapy if pt with continued suboccipital pressure. Consider SOT. Progress VOR/gaze stabilization/adaptation/habituation with walking or compliant surfaces.    PT Home Exercise Plan Access Code: [email or web address]    Consulted and Agree with Plan of Care Patient           Patient will benefit from skilled therapeutic intervention in order to improve the following deficits and impairments:  Decreased activity tolerance, Dizziness, Decreased balance, Difficulty walking, Decreased mobility  Visit Diagnosis: Dizziness and giddiness  Vestibular disequilibrium, unspecified laterality     Problem List There are no problems to display for this patient.   Adi Doro [address] PT, DPT 08/28/2020, 4:22 PM  [facility] [address] [place], [place], [address] Phone: [phone]   Fax:  [phone]  Name: Courtney Dudley MRN: [id number] Date of Birth: August 28, 1981

## 2020-09-04 ENCOUNTER — Ambulatory Visit: Payer: Federal, State, Local not specified - PPO | Admitting: Physical Therapy

## 2020-11-19 DIAGNOSIS — J309 Allergic rhinitis, unspecified: Secondary | ICD-10-CM | POA: Diagnosis not present

## 2020-11-19 DIAGNOSIS — H1010 Acute atopic conjunctivitis, unspecified eye: Secondary | ICD-10-CM | POA: Diagnosis not present

## 2020-11-19 DIAGNOSIS — L659 Nonscarring hair loss, unspecified: Secondary | ICD-10-CM | POA: Diagnosis not present

## 2020-12-14 ENCOUNTER — Other Ambulatory Visit: Payer: Self-pay | Admitting: Family Medicine

## 2020-12-14 DIAGNOSIS — Z1231 Encounter for screening mammogram for malignant neoplasm of breast: Secondary | ICD-10-CM

## 2021-02-03 ENCOUNTER — Ambulatory Visit
Admission: RE | Admit: 2021-02-03 | Discharge: 2021-02-03 | Disposition: A | Discharge status: Home / Self Care | Payer: Federal, State, Local not specified - PPO | Source: Ambulatory Visit | Attending: Family Medicine | Admitting: Family Medicine

## 2021-02-03 ENCOUNTER — Other Ambulatory Visit: Payer: Self-pay

## 2021-02-03 DIAGNOSIS — Z1231 Encounter for screening mammogram for malignant neoplasm of breast: Secondary | ICD-10-CM

## 2021-02-04 ENCOUNTER — Other Ambulatory Visit: Payer: Self-pay | Admitting: Family Medicine

## 2021-02-04 DIAGNOSIS — R928 Other abnormal and inconclusive findings on diagnostic imaging of breast: Secondary | ICD-10-CM

## 2021-02-17 ENCOUNTER — Ambulatory Visit
Admission: RE | Admit: 2021-02-17 | Discharge: 2021-02-17 | Disposition: A | Discharge status: Home / Self Care | Payer: Federal, State, Local not specified - PPO | Source: Ambulatory Visit | Attending: Family Medicine | Admitting: Family Medicine

## 2021-02-17 ENCOUNTER — Other Ambulatory Visit: Payer: Self-pay

## 2021-02-17 ENCOUNTER — Other Ambulatory Visit: Payer: Self-pay | Admitting: Family Medicine

## 2021-02-17 DIAGNOSIS — N6489 Other specified disorders of breast: Secondary | ICD-10-CM | POA: Diagnosis not present

## 2021-02-17 DIAGNOSIS — H16143 Punctate keratitis, bilateral: Secondary | ICD-10-CM | POA: Diagnosis not present

## 2021-02-17 DIAGNOSIS — R928 Other abnormal and inconclusive findings on diagnostic imaging of breast: Secondary | ICD-10-CM

## 2021-02-17 DIAGNOSIS — R922 Inconclusive mammogram: Secondary | ICD-10-CM | POA: Diagnosis not present

## 2021-02-23 DIAGNOSIS — H16143 Punctate keratitis, bilateral: Secondary | ICD-10-CM | POA: Diagnosis not present

## 2021-03-31 DIAGNOSIS — M79605 Pain in left leg: Secondary | ICD-10-CM | POA: Diagnosis not present

## 2021-03-31 DIAGNOSIS — N644 Mastodynia: Secondary | ICD-10-CM | POA: Diagnosis not present

## 2021-04-13 DIAGNOSIS — H04123 Dry eye syndrome of bilateral lacrimal glands: Secondary | ICD-10-CM | POA: Diagnosis not present

## 2021-04-30 DIAGNOSIS — Z111 Encounter for screening for respiratory tuberculosis: Secondary | ICD-10-CM | POA: Diagnosis not present

## 2021-05-03 ENCOUNTER — Other Ambulatory Visit: Payer: Self-pay

## 2021-05-03 ENCOUNTER — Other Ambulatory Visit: Payer: Self-pay | Admitting: Family Medicine

## 2021-05-03 ENCOUNTER — Ambulatory Visit
Admission: RE | Admit: 2021-05-03 | Discharge: 2021-05-03 | Disposition: A | Discharge status: Home / Self Care | Payer: Federal, State, Local not specified - PPO | Source: Ambulatory Visit | Attending: Family Medicine | Admitting: Family Medicine

## 2021-05-03 DIAGNOSIS — Z111 Encounter for screening for respiratory tuberculosis: Secondary | ICD-10-CM

## 2021-05-03 DIAGNOSIS — R7611 Nonspecific reaction to tuberculin skin test without active tuberculosis: Secondary | ICD-10-CM | POA: Diagnosis not present

## 2021-05-10 DIAGNOSIS — R42 Dizziness and giddiness: Secondary | ICD-10-CM | POA: Diagnosis not present

## 2021-05-10 DIAGNOSIS — J309 Allergic rhinitis, unspecified: Secondary | ICD-10-CM | POA: Diagnosis not present

## 2021-05-10 DIAGNOSIS — H6523 Chronic serous otitis media, bilateral: Secondary | ICD-10-CM | POA: Diagnosis not present

## 2021-05-10 DIAGNOSIS — M5430 Sciatica, unspecified side: Secondary | ICD-10-CM | POA: Diagnosis not present

## 2021-05-24 DIAGNOSIS — H04123 Dry eye syndrome of bilateral lacrimal glands: Secondary | ICD-10-CM | POA: Diagnosis not present

## 2021-07-16 DIAGNOSIS — H1045 Other chronic allergic conjunctivitis: Secondary | ICD-10-CM | POA: Diagnosis not present

## 2021-07-16 DIAGNOSIS — J300 Vasomotor rhinitis: Secondary | ICD-10-CM | POA: Diagnosis not present

## 2021-07-16 DIAGNOSIS — J309 Allergic rhinitis, unspecified: Secondary | ICD-10-CM | POA: Diagnosis not present

## 2021-08-03 DIAGNOSIS — Z0000 Encounter for general adult medical examination without abnormal findings: Secondary | ICD-10-CM | POA: Diagnosis not present

## 2021-08-03 DIAGNOSIS — E559 Vitamin D deficiency, unspecified: Secondary | ICD-10-CM | POA: Diagnosis not present

## 2021-08-03 DIAGNOSIS — E7800 Pure hypercholesterolemia, unspecified: Secondary | ICD-10-CM | POA: Diagnosis not present

## 2021-08-03 DIAGNOSIS — H04129 Dry eye syndrome of unspecified lacrimal gland: Secondary | ICD-10-CM | POA: Diagnosis not present

## 2021-08-23 ENCOUNTER — Ambulatory Visit
Admission: RE | Admit: 2021-08-23 | Discharge: 2021-08-23 | Disposition: A | Discharge status: Home / Self Care | Payer: Federal, State, Local not specified - PPO | Source: Ambulatory Visit | Attending: Family Medicine | Admitting: Family Medicine

## 2021-08-23 ENCOUNTER — Other Ambulatory Visit: Payer: Self-pay

## 2021-08-23 ENCOUNTER — Other Ambulatory Visit: Payer: Self-pay | Admitting: Family Medicine

## 2021-08-23 DIAGNOSIS — N6489 Other specified disorders of breast: Secondary | ICD-10-CM | POA: Diagnosis not present

## 2021-08-23 DIAGNOSIS — R928 Other abnormal and inconclusive findings on diagnostic imaging of breast: Secondary | ICD-10-CM

## 2021-08-23 DIAGNOSIS — R922 Inconclusive mammogram: Secondary | ICD-10-CM | POA: Diagnosis not present

## 2021-09-17 DIAGNOSIS — K0889 Other specified disorders of teeth and supporting structures: Secondary | ICD-10-CM | POA: Diagnosis not present

## 2021-09-17 DIAGNOSIS — J019 Acute sinusitis, unspecified: Secondary | ICD-10-CM | POA: Diagnosis not present

## 2021-11-12 ENCOUNTER — Encounter: Payer: Self-pay | Admitting: Neurology

## 2021-11-24 ENCOUNTER — Ambulatory Visit: Payer: Federal, State, Local not specified - PPO | Admitting: Neurology

## 2021-12-02 ENCOUNTER — Ambulatory Visit: Payer: Federal, State, Local not specified - PPO | Admitting: Neurology

## 2021-12-17 ENCOUNTER — Ambulatory Visit: Payer: Federal, State, Local not specified - PPO | Admitting: Neurology

## 2022-01-11 ENCOUNTER — Encounter: Payer: Self-pay | Admitting: Neurology

## 2022-01-11 ENCOUNTER — Ambulatory Visit: Payer: Federal, State, Local not specified - PPO | Admitting: Neurology

## 2022-01-11 VITALS — BP 109/76 | HR 67 | Ht 65.0 in | Wt 145.0 lb

## 2022-01-11 DIAGNOSIS — H04203 Unspecified epiphora, bilateral lacrimal glands: Secondary | ICD-10-CM

## 2022-01-11 DIAGNOSIS — G43009 Migraine without aura, not intractable, without status migrainosus: Secondary | ICD-10-CM | POA: Diagnosis not present

## 2022-01-11 NOTE — Progress Notes (Signed)
? ?NEUROLOGY CONSULTATION NOTE ? ?Courtney Dudley ?MRN: [id number] ?DOB: [birth year] ? ?Referring provider: Dr. Mila Palmer ?Primary care provider: Dr. Mila Palmer ? ?Reason for consult:  light sensitivity,r/o partial seizures ? ?Dear Dr Courtney Dudley: ? ?Thank you for your kind referral of Courtney Dudley for consultation of the above symptoms. Although her history is well known to you, please allow me to reiterate it for the purpose of our medical record. She is alone in the office today. Records and images were personally reviewed where available. ? ? ?HISTORY OF PRESENT ILLNESS: ?This is a very pleasant 41 year old right-handed woman with no significant past medical history presenting for evaluation of excessive tearing/light sensitivity. Records were reviewed. In April 2021, she started having vertigo with a spinning sensation and imbalance. She also had headaches and was treated for BPPV which resolved after vestibular therapy. She then started having continued mild imbalance and dizziness affecting her home/work tasks and saw ENT specialist Dr. Suszanne Conners who referred her for another course of vestibular therapy. She reported suboccipital pressure but no neck stiffness. Symptoms improved but she continued to report pressure/tension behind her eyes on her last physical therapy visit in 2021. She notes that a few months later, she started having recurrent episodes of excessive tearing in both eyes when she would drive home and eyes would get exposed to sunlight or in a room with fluorescent lights. This would get to a point where she would have a burning sensation and heaviness in her eyes and she can barely open them. She would close her eyes for a few minute and feel better, but there are days when eye symptoms are more painful and she would tend to have a headache. The headache is tolerable, no associated nausea/vomiting. She feels dizzy, similar to sensation of coming up from a bending position. She denies any  visual obscurations, conjunctival injection, ptosis, or nasal stuffiness. She has a history of catamenial migraines that are different from the headaches she has with the tearing. Triggers include bright sunlight, often occurring when she is outdoors, but also looking at a computer screen or fluorescent lights. She has stopped using her contact lenses or eye makeup. There are no associated speech changes, confusion, loss of awareness, focal numbness/tingling/weakness, neck pain. No falls. She reports seeing 2 ophthalmologists and had tried Pataday and Restasis with no relief. There is no family history of similar symptoms. She usually gets 8 hours of sleep. She eats a gluten-free diet. 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  ? ?Laboratory Data:  ?TSH, SS-A, SS-B negative ? ? ?PAST MEDICAL HISTORY: ?History reviewed. No pertinent past medical history. ? ?PAST SURGICAL HISTORY: ?History reviewed. No pertinent surgical history. ? ?MEDICATIONS: ?Current Outpatient Medications on File Prior to Visit  ?Medication Sig Dispense Refill  ? clindamycin (CLEOCIN-T) 1 % external solution Apply topically 2 (two) times daily. (Patient not taking: Reported on 07/31/2020) 30 mL 5  ? meclizine (ANTIVERT) 25 MG tablet Take 1 tablet (25 mg total) by mouth 3 (three) times daily as needed for dizziness. (Patient not taking: Reported on 01/11/2022) 30 tablet 0  ? ?No current facility-administered medications on file prior to visit.  ? ? ?ALLERGIES: ?No Known Allergies ? ?FAMILY HISTORY: ?History reviewed. No pertinent family history. ? ?SOCIAL HISTORY: ?Social History  ? ?Socioeconomic History  ? Marital status: Married  ?  Spouse name: Courtney Dudley  ? Number of children: 3  ? Years of education: Not on file  ? Highest education  level: Not on file  ?Occupational History  ? Not on file  ?Tobacco Use  ? Smoking status: Never  ? Smokeless tobacco: Never  ?Vaping Use  ? Vaping Use: Never used  ?Substance and Sexual Activity  ?  Alcohol use: No  ? Drug use: No  ? Sexual activity: Yes  ?Other Topics Concern  ? Not on file  ?Social History Narrative  ? Right Handed  ? Lives in a 2 story home  ? Drinks caffeine   ? ?Social Determinants of Health  ? ?Financial Resource Strain: Not on file  ?Food Insecurity: Not on file  ?Transportation Needs: Not on file  ?Physical Activity: Not on file  ?Stress: Not on file  ?Social Connections: Not on file  ?Intimate Partner Violence: Not on file  ? ? ? ?PHYSICAL EXAM: ?Vitals:  ? 01/11/22 0854  ?BP: 109/76  ?Pulse: 67  ?SpO2: 100%  ? ?General: No acute distress ?Head:  Normocephalic/atraumatic, no conjunctival injection ?Skin/Extremities: No rash, no edema ?Neurological Exam: ?Mental status: alert and oriented to person, place, and time, no dysarthria or aphasia, Fund of knowledge is appropriate.  Recent and remote memory are intact.  Attention and concentration are normal.    ?Cranial nerves: ?CN I: not tested ?CN II: pupils equal, round and reactive to light, visual fields intact ?CN III, IV, VI:  full range of motion, no nystagmus, no ptosis ?CN V: facial sensation intact ?CN VII: upper and lower face symmetric ?CN VIII: hearing intact to conversation ?Bulk & Tone: normal, no fasciculations. ?Motor: 5/5 throughout with no pronator drift. ?Sensation: intact to light touch, cold, pin, vibration and joint position sense.  No extinction to double simultaneous stimulation.  Romberg test negative ?Deep Tendon Reflexes: +2 throughout ?Cerebellar: no incoordination on finger to nose testing ?Gait: narrow-based and steady, able to tandem walk adequately. ?Tremor: none ? ? ?IMPRESSION: ?This is a very pleasant 41 year old right-handed woman with no significant past medical history presenting for evaluation of potential neurological cause of excessive tearing/light sensitivity. Her neurological exam is normal. She reports seeing 2 eye doctors with no relief from eye drops. Etiology of symptoms unclear, most likely  still a primary ophthalmologic cause, however since she reports that when symptoms progress she would tend to have a mild headache, we can try treating as migraine. History is not consistent with seizures. Since she has seen 2 ophthalmologists, recommend Neuro-ophthalmology evaluation. She will try polarized lenses and if no improvement in symptoms in a month, we can do a trial of migraine preventative medication and assess for effect.  Follow-up after Neuro-ophthalmology evaluation, she knows to call for any changes.  ? ? ?Thank you for allowing me to participate in the care of this patient. Please do not hesitate to call for any questions or concerns. ? ? ?Patrcia Dolly, M.D. ? ?CC: Dr. Paulino Dudley ? ? ?

## 2022-01-11 NOTE — Patient Instructions (Signed)
Good to meet you! ? ?We will send the referral to Neuro-ophthalmology at [facility] ? ?2. Let's try Theraspecs ([email or web address]) for a month and see if this helps with the symptoms ? ?3. Send me a message on MyChart once you get the Theraspecs and the [facility] appointment so we can get you back for a follow-up a month after trying Theraspecs ? ?4. Call for any questions/concerns ?

## 2022-01-20 ENCOUNTER — Telehealth: Payer: Self-pay

## 2022-01-20 NOTE — Telephone Encounter (Signed)
Called [facility] and spoke to scheduling and patient has been scheduled with Dr. Nile Riggs for March 07, 2022 at 9:00am. Address: [address] [place]. They ask that patient arrive 15 minutes early, bring a list of current medications, and mask are optional. Refferal has been sent to their office at fax#:[phone].  ? ?Mychart message has been sent to patient with appointment details provided. Patient records have been faxed to Dr. Ashley Royalty office.  ?

## 2022-02-23 ENCOUNTER — Telehealth: Payer: Self-pay | Admitting: Neurology

## 2022-02-23 NOTE — Telephone Encounter (Signed)
Dr. Ashley Royalty office called in stating they are scheduled to see this patient in June, but she needs to see Dr. Daphine Deutscher. Dr. Ermalene Searing phone number is [phone]

## 2022-02-25 NOTE — Telephone Encounter (Signed)
Called Dr. Benita Stabile office and talked to [facility] and she said they would change pt appointment to [facility] office due to the pt needs to see Dr Daphine Deutscher and not Dr. Nile Riggs. She did not need [place] to refax anything and they will get in touch with pt and make an appointment. She canceled the appointment with Nile Riggs.

## 2022-03-09 NOTE — Telephone Encounter (Signed)
Can you pls check with her if she has been to the [facility]? If not, would send referral there. thanks

## 2022-03-09 NOTE — Telephone Encounter (Signed)
Called Pt and informed her of Dr. Lars Masson message and she doesn't know any Dr. At [facility]. She is okay with referral to [facility].

## 2022-03-10 ENCOUNTER — Other Ambulatory Visit: Payer: Self-pay

## 2022-03-11 NOTE — Telephone Encounter (Signed)
Pt referral sent to [facility]

## 2022-03-18 NOTE — Telephone Encounter (Signed)
Called Pt back and she give me the fax number for her eye to give them the information to send to [facility].

## 2022-03-24 ENCOUNTER — Ambulatory Visit
Admission: RE | Admit: 2022-03-24 | Discharge: 2022-03-24 | Disposition: A | Discharge status: Home / Self Care | Payer: Federal, State, Local not specified - PPO | Source: Ambulatory Visit | Attending: Family Medicine | Admitting: Family Medicine

## 2022-03-24 DIAGNOSIS — R922 Inconclusive mammogram: Secondary | ICD-10-CM | POA: Diagnosis not present

## 2022-03-24 DIAGNOSIS — R928 Other abnormal and inconclusive findings on diagnostic imaging of breast: Secondary | ICD-10-CM

## 2022-04-06 ENCOUNTER — Telehealth: Payer: Self-pay | Admitting: Neurology

## 2022-04-06 ENCOUNTER — Other Ambulatory Visit: Payer: Self-pay | Admitting: Neurology

## 2022-04-06 DIAGNOSIS — R519 Headache, unspecified: Secondary | ICD-10-CM

## 2022-04-06 MED ORDER — TOPIRAMATE 25 MG PO TABS: ORAL_TABLET | ORAL | 0 refills | Status: DC

## 2022-04-06 NOTE — Telephone Encounter (Signed)
Patient would like a call back to discuss getting an MRI done and/or medicine for her headaches. They have got worse

## 2022-04-06 NOTE — Telephone Encounter (Signed)
Called patient and she stated that she has been having headaches that wont go away. Patient states that her headaches comes and goes. She has tried using the eye glasses that Dr. Karel Jarvis reccommended but they have not helped. Patient states she has taken tylenol for her headaches but it has not helped.  Patient wanted to see if she would get a MRI of her head to make sure there isn't anything going on that maybe causing her headaches. She says she is starting to have some anxiety because of her headaches.   I informed patient that I will send her message to Dr. Karel Jarvis and give her a call back once I hear back. Patient verbalized understanding and had no further questions or concerns.

## 2022-04-06 NOTE — Telephone Encounter (Signed)
Called patient and informed her of Dr. Rosalyn Gess recommendations per below. Patient is agreeable to have her MRI sent to [facility] imaging and to have a Topamax rx sent in. Patient was advised on possible side effects. Patient also aware that [facility] imaging will call her to schedule her MRI once they get her PA approved.  Patient has not heard from the Ophthalmologist office as of yet. Informed patient that I would speak to [facility] about her referral and get back to her.

## 2022-04-06 NOTE — Telephone Encounter (Signed)
Pls order MRI brain without contrast (dx: new onset headaches, vision changes). Also pls let her know we will start Topiramate 25mg : take 1 tablet at bedtime. We always start at a low dose for her body to get used to the medication. Common side effects may include numbness/tingling in her hands and feet so don't get scared if that happens. Carbonated drinks/soda may taste different. If she is agreeable to this, pls send in Rx, thanks

## 2022-04-07 NOTE — Telephone Encounter (Signed)
Spoke to [place] and she informed me that patient is supposed to contact her ophthalmologist that has performed her routine eye exam and have them send a referral. She was provided the fax number of: [phone] to have her Ophthalmologist send the referral to.   Called patient and informed her of above. Patient has requested I send her this information ion MyChart so she can call her Ophthalmologist and have them send a referral for her to [facility]. Patient verbalized understanding and had no further questions or concerns.   Mychart message has been sent to patient.

## 2022-04-10 ENCOUNTER — Ambulatory Visit
Admission: RE | Admit: 2022-04-10 | Discharge: 2022-04-10 | Disposition: A | Discharge status: Home / Self Care | Payer: Federal, State, Local not specified - PPO | Source: Ambulatory Visit | Attending: Neurology | Admitting: Neurology

## 2022-04-10 DIAGNOSIS — R519 Headache, unspecified: Secondary | ICD-10-CM | POA: Diagnosis not present

## 2022-04-11 ENCOUNTER — Telehealth: Payer: Self-pay | Admitting: Neurology

## 2022-04-11 NOTE — Telephone Encounter (Signed)
Patient had an MRI yesterday, would like to know if results are in

## 2022-04-11 NOTE — Telephone Encounter (Signed)
Pt advised that we do not have the results yet but once its released by the dr, we will call her with the results,

## 2022-04-13 ENCOUNTER — Telehealth: Payer: Self-pay | Admitting: Neurology

## 2022-04-13 ENCOUNTER — Telehealth: Payer: Self-pay

## 2022-04-13 DIAGNOSIS — R300 Dysuria: Secondary | ICD-10-CM | POA: Diagnosis not present

## 2022-04-13 DIAGNOSIS — L568 Other specified acute skin changes due to ultraviolet radiation: Secondary | ICD-10-CM | POA: Diagnosis not present

## 2022-04-13 DIAGNOSIS — R42 Dizziness and giddiness: Secondary | ICD-10-CM | POA: Diagnosis not present

## 2022-04-13 DIAGNOSIS — E559 Vitamin D deficiency, unspecified: Secondary | ICD-10-CM | POA: Diagnosis not present

## 2022-04-13 DIAGNOSIS — F411 Generalized anxiety disorder: Secondary | ICD-10-CM | POA: Diagnosis not present

## 2022-04-13 NOTE — Telephone Encounter (Signed)
Notes sent via fax

## 2022-04-13 NOTE — Telephone Encounter (Signed)
Arline Asp from [facility] called about a referral to give the appt time with Dr Daphine Deutscher.  It is scheduled for Tuesday August 29 at 8:20 AM.

## 2022-04-13 NOTE — Telephone Encounter (Signed)
Pt called and she has an appointment with Dr. Daphine Deutscher in Aug.

## 2022-04-13 NOTE — Telephone Encounter (Signed)
[place] called needing notes faxed from last appointment for this referral.  Fax # [phone].  The callback # is [phone].

## 2022-05-24 DIAGNOSIS — H04203 Unspecified epiphora, bilateral lacrimal glands: Secondary | ICD-10-CM | POA: Diagnosis not present

## 2022-08-11 DIAGNOSIS — F411 Generalized anxiety disorder: Secondary | ICD-10-CM | POA: Diagnosis not present

## 2022-08-15 DIAGNOSIS — E559 Vitamin D deficiency, unspecified: Secondary | ICD-10-CM | POA: Diagnosis not present

## 2022-08-15 DIAGNOSIS — Z23 Encounter for immunization: Secondary | ICD-10-CM | POA: Diagnosis not present

## 2022-08-15 DIAGNOSIS — Z0000 Encounter for general adult medical examination without abnormal findings: Secondary | ICD-10-CM | POA: Diagnosis not present

## 2022-08-15 DIAGNOSIS — Z79899 Other long term (current) drug therapy: Secondary | ICD-10-CM | POA: Diagnosis not present

## 2022-08-15 DIAGNOSIS — E7800 Pure hypercholesterolemia, unspecified: Secondary | ICD-10-CM | POA: Diagnosis not present

## 2022-08-16 ENCOUNTER — Telehealth: Payer: Self-pay | Admitting: Neurology

## 2022-08-16 NOTE — Telephone Encounter (Signed)
If she feels Topamax was helping, she can take it in the morning or noon to see if it is tolerated better, thanks

## 2022-08-16 NOTE — Telephone Encounter (Signed)
Does she think it is helping though? We started the Topamax if in case her symptoms are migraine-related, however I reviewed the neuro-ophthalmologist's note and he said  Recommend patient used PFATs four times daily and have an evaluation with a cornea specialist for further management. No evidence to suggest a neurologic cause of epiphora. If patient has no improvement with intervention, could consider referral to oculoplastics for evaluation of possible intermittent nasolacrimal duct obstruction.   Has she been able to do their recommendations?

## 2022-08-16 NOTE — Telephone Encounter (Signed)
1. Which medications need refilled? (List name and dosage, if known) topamax  2. Which pharmacy/location is medication to be sent to? (include street and city if local pharmacy) [address]  Rock Nephew called in stating she would like to find out if she can take the topamax at a different time of day because it interferes with her sleep?  She also stated the Theraspecs did not work for her.

## 2022-08-16 NOTE — Telephone Encounter (Signed)
Called patient and she ha upcoming appt with [facility] to see the cornea specialist on Friday and will call the office back to let us know what they said. Patient said she felt as if the Topamax was working but not sure with everything else that is going on at this time with her other appointments

## 2022-08-19 DIAGNOSIS — H04123 Dry eye syndrome of bilateral lacrimal glands: Secondary | ICD-10-CM | POA: Diagnosis not present

## 2022-08-19 DIAGNOSIS — H04203 Unspecified epiphora, bilateral lacrimal glands: Secondary | ICD-10-CM | POA: Diagnosis not present

## 2022-09-02 ENCOUNTER — Encounter: Payer: Self-pay | Admitting: Neurology

## 2022-09-02 ENCOUNTER — Ambulatory Visit: Payer: Federal, State, Local not specified - PPO | Admitting: Neurology

## 2022-09-02 VITALS — BP 110/75 | HR 59 | Ht 65.0 in | Wt 143.6 lb

## 2022-09-02 DIAGNOSIS — H04203 Unspecified epiphora, bilateral lacrimal glands: Secondary | ICD-10-CM

## 2022-09-02 DIAGNOSIS — G43009 Migraine without aura, not intractable, without status migrainosus: Secondary | ICD-10-CM | POA: Diagnosis not present

## 2022-09-02 MED ORDER — SUMATRIPTAN SUCCINATE 50 MG PO TABS: ORAL_TABLET | ORAL | 6 refills | Status: DC

## 2022-09-02 NOTE — Patient Instructions (Signed)
Good to see you.  Try the sumatriptan 50mg  as needed at onset of migraine. Do not take more than 3 a week.  2. We will check on Oculoplastics referral  3. Follow-up in 6 months, call for any changes

## 2022-09-02 NOTE — Progress Notes (Signed)
NEUROLOGY FOLLOW UP OFFICE NOTE  Courtney Dudley [id number] 07/27/1981  HISTORY OF PRESENT ILLNESS: I had the pleasure of seeing Courtney Dudley in follow-up in the neurology clinic on 09/02/2022.  The patient was last seen 8 months ago for excessive tearing/light sensitivity.  She is alone in the office today. Records and images were personally reviewed where available.  I personally reviewed MRI brain without contrast done 03/2022 which was normal. She was kindly seen by Neuro-ophthalmologist Dr. Daphine Deutscher at [facility] with a diagnosis of epiphora. Per notes, "She has MGD and decreased TBUT which suggest a tear film insufficiency. Recommend patient used PFATs four times daily and have an evaluation with a cornea specialist for further management. No evidence to suggest a neurologic cause of epiphora. If patient has no improvement with intervention, could consider referral to oculoplastics for evaluation of possible intermittent nasolacrimal duct obstruction." She has seen a cornea specialist and has been using the PFAT four times a day. She thought it was helping, then after a week symptoms recurred where she could not open her eyes with burning and light sensitivity. It happened this morning, she used the eye drops and felt better for an hour then symptoms recurred. She is unsure how often she can use the eye drops. She was previously reporting catamenial migraines but now they occur more often. Sometimes she is not sure if she is having a migraine when her eyes are bothering her more. She tries to stay hydrated. She was started on Topiramate for migraine prophylaxis which she thinks helped but affected her stomach.    History on Initial Assessment 01/11/2022: This is a very pleasant 41 year old right-handed woman with no significant past medical history presenting for evaluation of excessive tearing/light sensitivity. Records were reviewed. In April 2021, she started having vertigo with a spinning  sensation and imbalance. She also had headaches and was treated for BPPV which resolved after vestibular therapy. She then started having continued mild imbalance and dizziness affecting her home/work tasks and saw ENT specialist Dr. Suszanne Conners who referred her for another course of vestibular therapy. She reported suboccipital pressure but no neck stiffness. Symptoms improved but she continued to report pressure/tension behind her eyes on her last physical therapy visit in 2021. She notes that a few months later, she started having recurrent episodes of excessive tearing in both eyes when she would drive home and eyes would get exposed to sunlight or in a room with fluorescent lights. This would get to a point where she would have a burning sensation and heaviness in her eyes and she can barely open them. She would close her eyes for a few minute and feel better, but there are days when eye symptoms are more painful and she would tend to have a headache. The headache is tolerable, no associated nausea/vomiting. She feels dizzy, similar to sensation of coming up from a bending position. She denies any visual obscurations, conjunctival injection, ptosis, or nasal stuffiness. She has a history of catamenial migraines that are different from the headaches she has with the tearing. Triggers include bright sunlight, often occurring when she is outdoors, but also looking at a computer screen or fluorescent lights. She has stopped using her contact lenses or eye makeup. There are no associated speech changes, confusion, loss of awareness, focal numbness/tingling/weakness, neck pain. No falls. She reports seeing 2 ophthalmologists and had tried Pataday and Restasis with no relief. There is no family history of similar symptoms. She usually gets 8 hours  of sleep. She eats a gluten-free diet. 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   Laboratory Data:  TSH, SS-A, SS-B negative   PAST MEDICAL HISTORY: History  reviewed. No pertinent past medical history.  MEDICATIONS: Current Outpatient Medications on File Prior to Visit  Medication Sig Dispense Refill   meclizine (ANTIVERT) 25 MG tablet Take 1 tablet (25 mg total) by mouth 3 (three) times daily as needed for dizziness. 30 tablet 0   Multiple Vitamin (MULTIVITAMIN) tablet Take 1 tablet by mouth daily.     Olopatadine HCl (PATADAY) 0.2 % SOLN 1 drop into affected eye Ophthalmic Once a day     topiramate (TOPAMAX) 25 MG tablet TAKE 1 TABLET BY MOUTH AT BEDTIME (Patient not taking: Reported on 09/02/2022) 90 tablet 0   No current facility-administered medications on file prior to visit.    ALLERGIES: No Known Allergies  FAMILY HISTORY: History reviewed. No pertinent family history.  SOCIAL HISTORY: Social History   Socioeconomic History   Marital status: Married    Spouse name: [name]   Number of children: 3   Years of education: Not on file   Highest education level: Not on file  Occupational History   Not on file  Tobacco Use   Smoking status: Never   Smokeless tobacco: Never  Vaping Use   Vaping Use: Never used  Substance and Sexual Activity   Alcohol use: No   Drug use: No   Sexual activity: Yes  Other Topics Concern   Not on file  Social History Narrative   Right Handed   Lives in a 2 story home   Drinks caffeine    Social Determinants of Health   Financial Resource Strain: Not on file  Food Insecurity: Not on file  Transportation Needs: Not on file  Physical Activity: Not on file  Stress: Not on file  Social Connections: Not on file  Intimate Partner Violence: Not on file     PHYSICAL EXAM: Vitals:   09/02/22 1259  BP: 110/75  Pulse: (!) 59  SpO2: 100%   General: No acute distress Head:  Normocephalic/atraumatic Skin/Extremities: No rash, no edema Neurological Exam: alert and awake. No aphasia or dysarthria. Fund of knowledge is appropriate.  Attention and concentration are normal.   Cranial nerves:  Pupils equal, round. Extraocular movements intact.  No facial asymmetry.  Motor: moves all extremities symmetrically. Gait narrow-based and steady.   IMPRESSION: This is a very pleasant 41 yo RH woman with catamenial migraines, who presented for evaluation of potential neurological cause of excessive tearing/light sensitivity. We discussed her normal neurological exam and brain MRI. She has also been evaluated by Neuro-ophthalmology, diagnosed with epiphora with no evidence to suggest a neurologic cause of epiphora. We reviewed Dr. Ermalene Searing recommendations, she has seen the cornea specialist and started PFATs but continues to have bothersome eye symptoms. She would like to proceed with his recommendation of seeing Oculoplastics. We discussed her migraines, she will try sumatriptan 50mg  as needed for migraine rescue, side effects discussed. Follow-up in 6 months, call for any changes.     Thank you for allowing me to participate in her care.  Please do not hesitate to call for any questions or concerns.    Patrcia Dolly, M.D.   CC: Dr. Paulino Rily

## 2022-09-28 DIAGNOSIS — F411 Generalized anxiety disorder: Secondary | ICD-10-CM | POA: Diagnosis not present

## 2022-10-12 ENCOUNTER — Encounter: Payer: Self-pay | Admitting: Neurology

## 2022-11-17 DIAGNOSIS — F411 Generalized anxiety disorder: Secondary | ICD-10-CM | POA: Diagnosis not present

## 2022-12-27 DIAGNOSIS — M79605 Pain in left leg: Secondary | ICD-10-CM | POA: Diagnosis not present

## 2023-01-10 DIAGNOSIS — F411 Generalized anxiety disorder: Secondary | ICD-10-CM | POA: Diagnosis not present

## 2023-02-08 IMAGING — MG MM DIGITAL SCREENING BILAT W/ TOMO AND CAD
8 series · 9 of 24 positions shown · non-contrast
Comparison: None.

CLINICAL DATA: Screening.

EXAM:
DIGITAL SCREENING BILATERAL MAMMOGRAM WITH TOMOSYNTHESIS AND CAD
TECHNIQUE: Bilateral screening digital craniocaudal and mediolateral oblique
mammograms were obtained. Bilateral screening digital breast
tomosynthesis was performed. The images were evaluated with
computer-aided detection.

[R CC synth-2D]
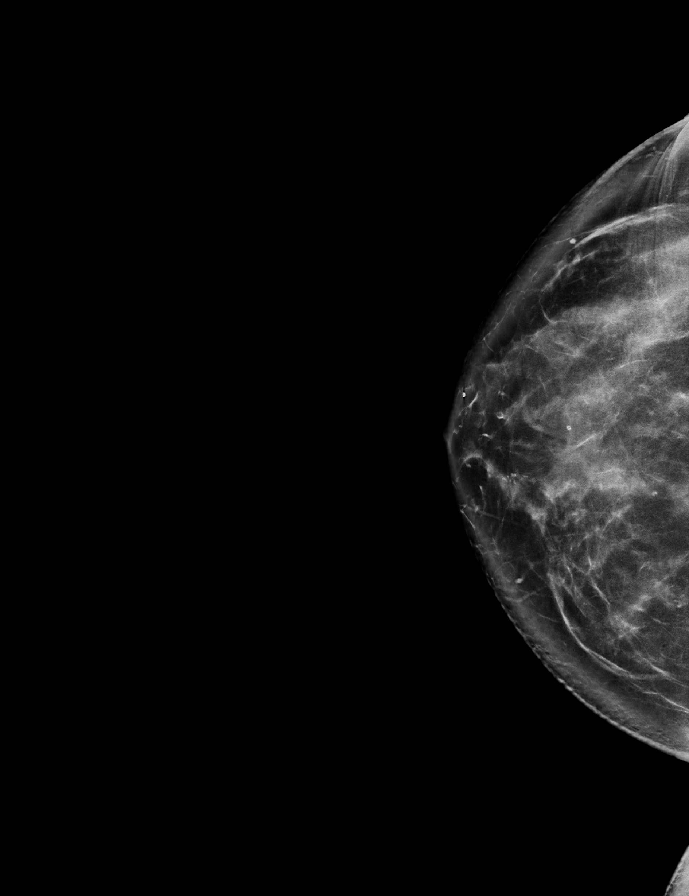

[R MLO synth-2D]
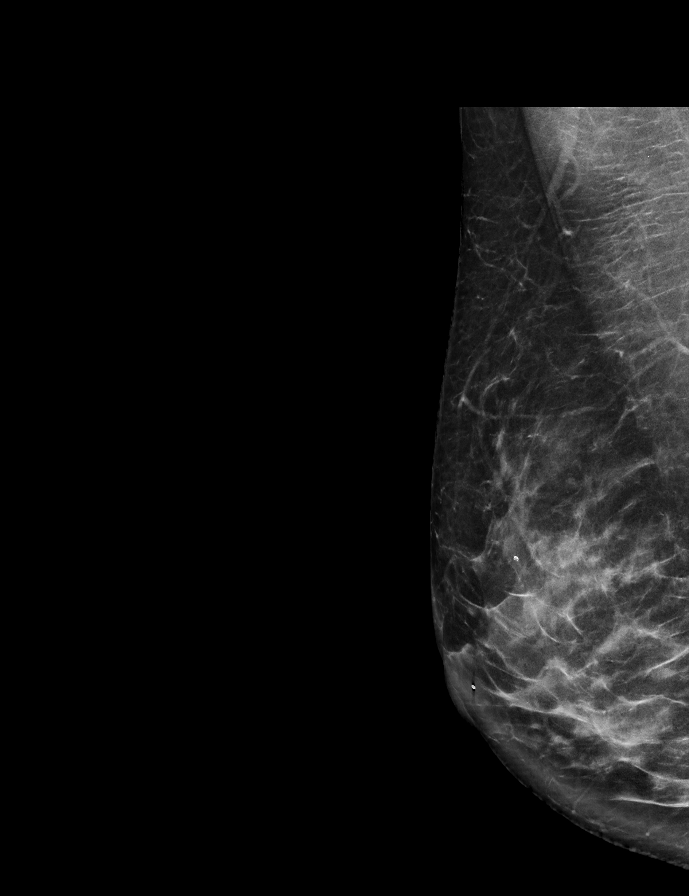

[L MLO synth-2D]
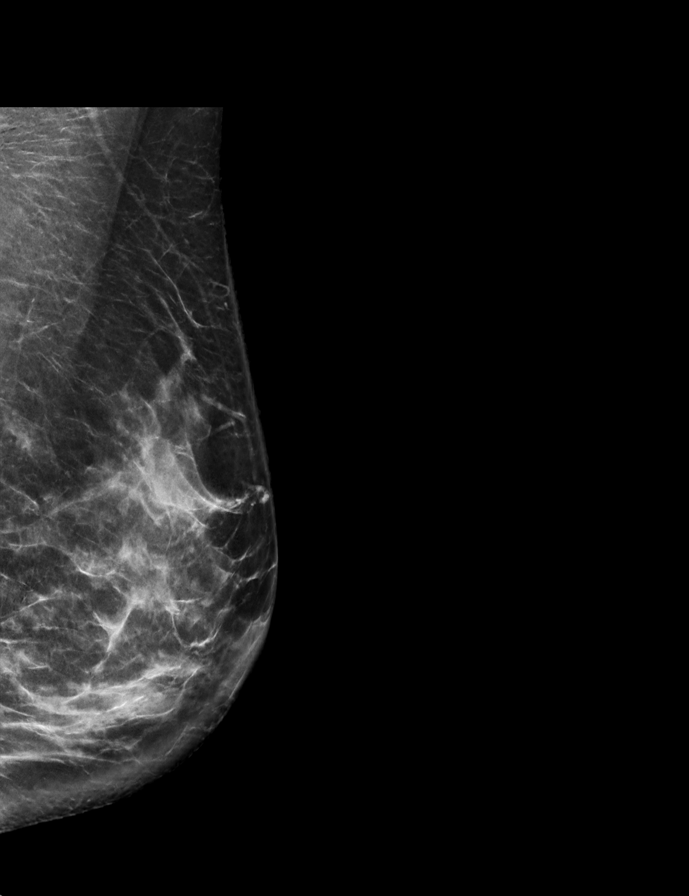

[L CC synth-2D]
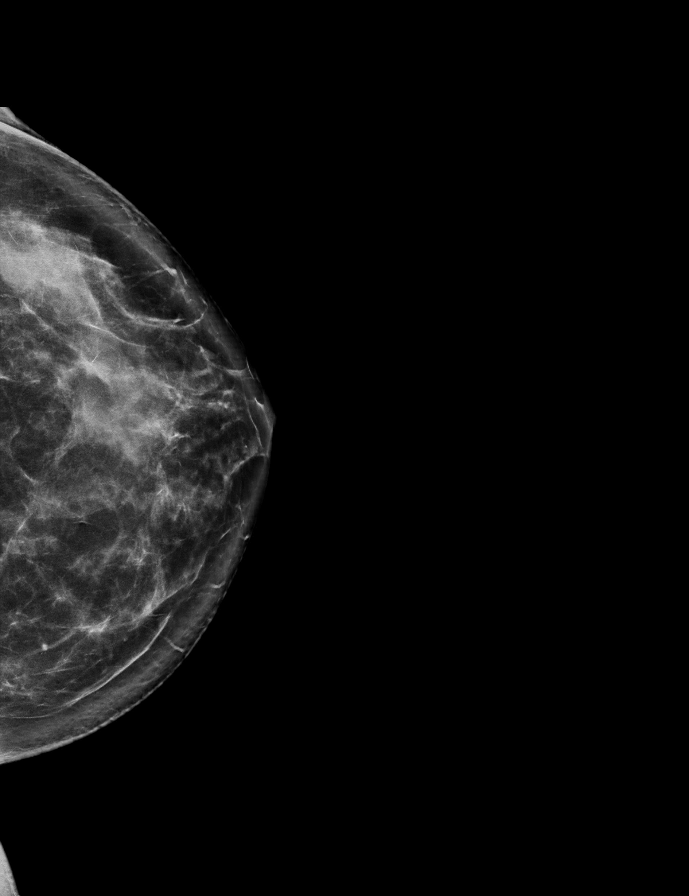

[R MLO tomo · 2 of 76 frames shown]
[frame 25/76]
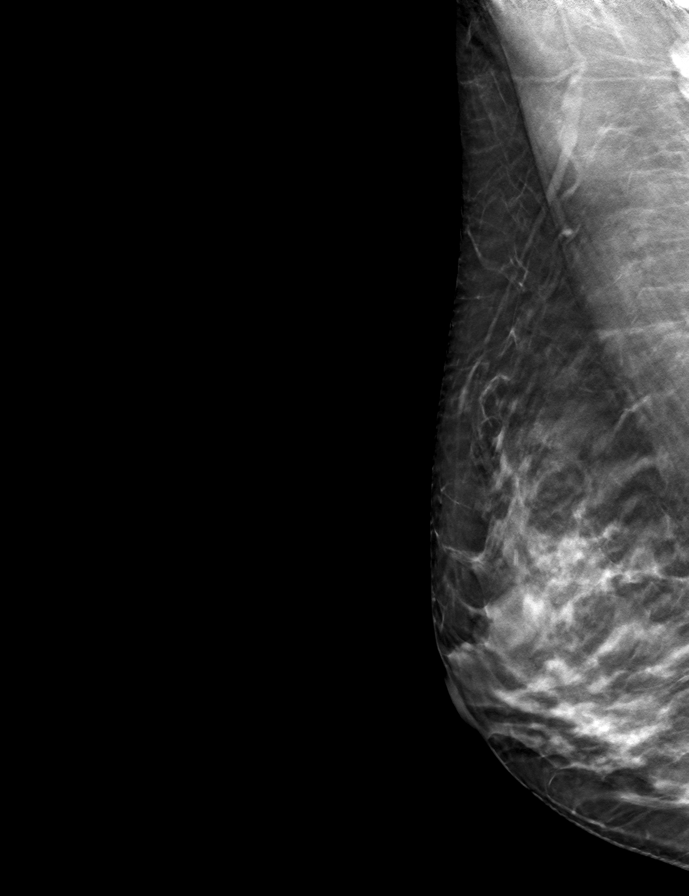
[frame 39/76]
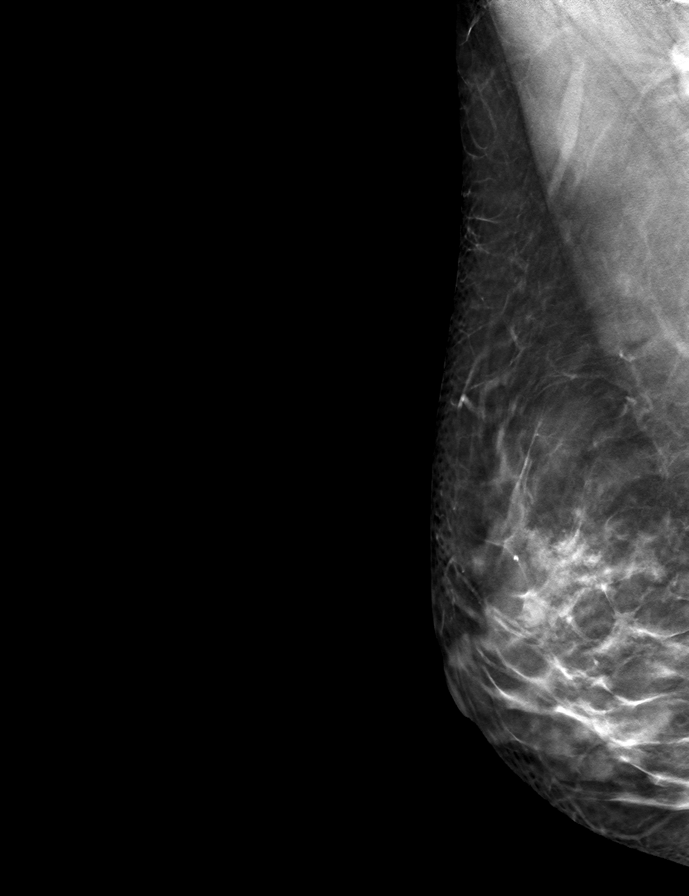

[L CC tomo · tomo slice 38/75.0]
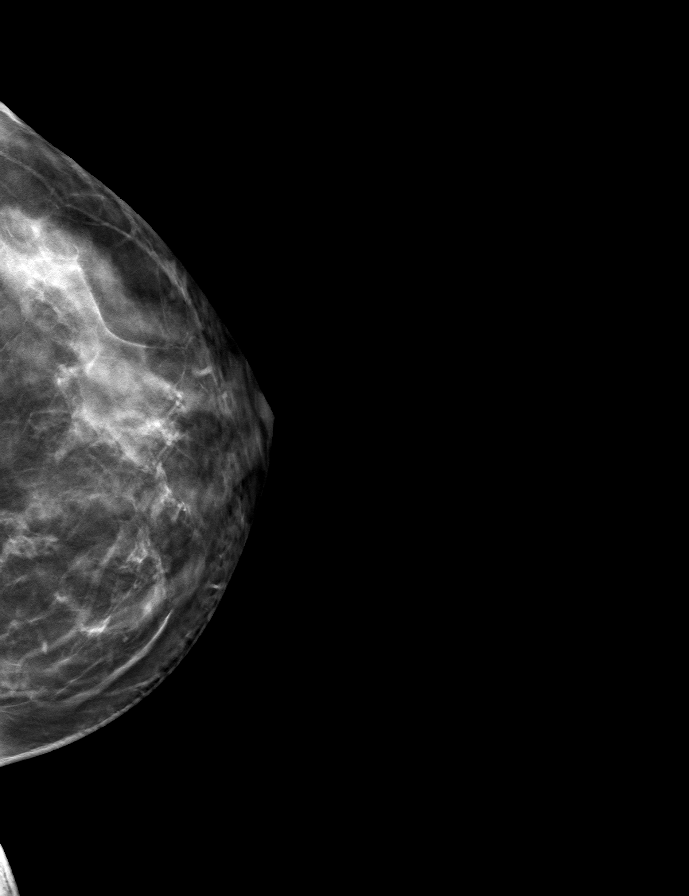

[L MLO tomo · tomo slice 39/78.0]
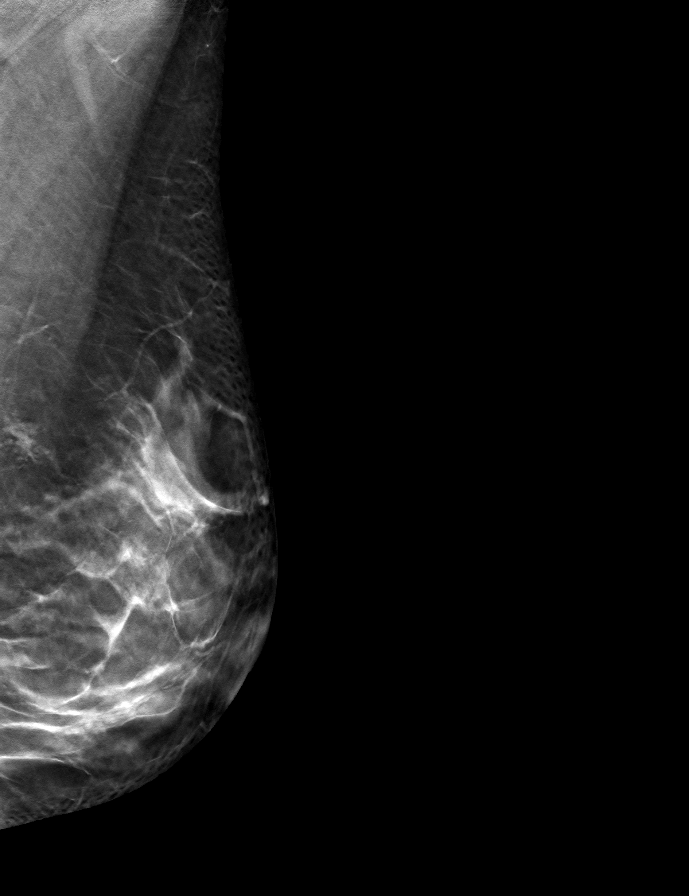

[R CC tomo · tomo slice 39/78.0]
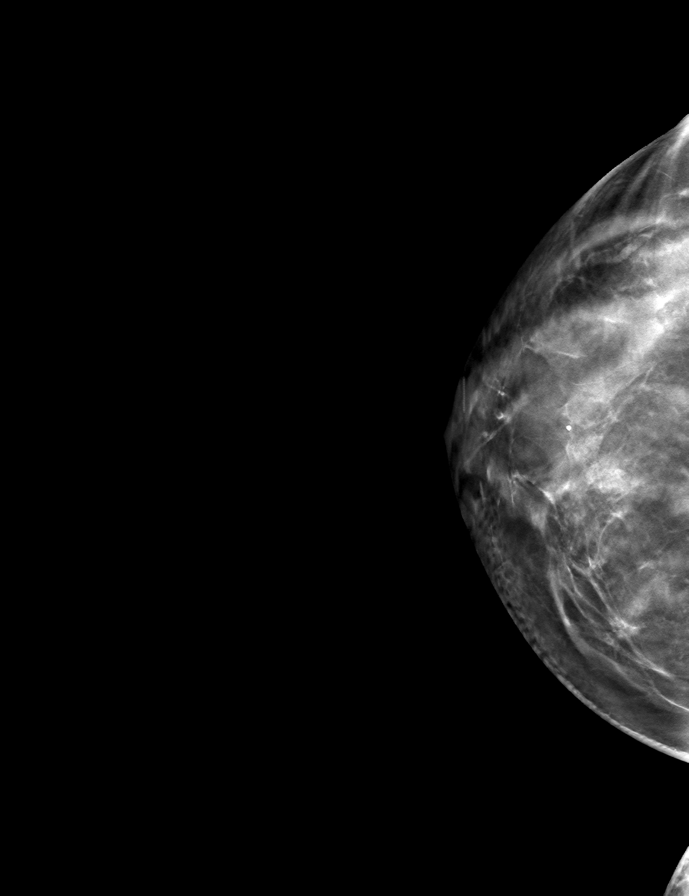

[9 of 24 positions shown; findings below may reference images not displayed]

Baseline.

ACR Breast Density Category c: The breast tissue is heterogeneously
dense, which may obscure small masses.
FINDINGS: In the left breast, a possible asymmetry in the posterior upper
breast in the oblique projection warrants further evaluation. In the
right breast, no findings suspicious for malignancy.
IMPRESSION: Further evaluation is suggested for possible asymmetry in the left
breast.

RECOMMENDATION:
Diagnostic mammogram and possibly ultrasound of the left breast.
(Code:[id number])

The patient will be contacted regarding the findings, and additional
imaging will be scheduled.

BI-RADS CATEGORY  0: Incomplete. Need additional imaging evaluation
and/or prior mammograms for comparison.

## 2023-02-27 ENCOUNTER — Ambulatory Visit: Payer: Federal, State, Local not specified - PPO | Admitting: Neurology

## 2023-03-06 ENCOUNTER — Ambulatory Visit: Payer: Federal, State, Local not specified - PPO | Admitting: Neurology

## 2023-03-09 DIAGNOSIS — F411 Generalized anxiety disorder: Secondary | ICD-10-CM | POA: Diagnosis not present

## 2023-04-20 DIAGNOSIS — F411 Generalized anxiety disorder: Secondary | ICD-10-CM | POA: Diagnosis not present

## 2023-05-01 DIAGNOSIS — F411 Generalized anxiety disorder: Secondary | ICD-10-CM | POA: Diagnosis not present

## 2023-05-09 ENCOUNTER — Other Ambulatory Visit: Payer: Self-pay | Admitting: Family Medicine

## 2023-05-09 DIAGNOSIS — N6489 Other specified disorders of breast: Secondary | ICD-10-CM

## 2023-05-11 DIAGNOSIS — M542 Cervicalgia: Secondary | ICD-10-CM | POA: Diagnosis not present

## 2023-05-11 DIAGNOSIS — R42 Dizziness and giddiness: Secondary | ICD-10-CM | POA: Diagnosis not present

## 2023-05-11 DIAGNOSIS — G4452 New daily persistent headache (NDPH): Secondary | ICD-10-CM | POA: Diagnosis not present

## 2023-06-01 DIAGNOSIS — L309 Dermatitis, unspecified: Secondary | ICD-10-CM | POA: Diagnosis not present

## 2023-06-06 DIAGNOSIS — Z124 Encounter for screening for malignant neoplasm of cervix: Secondary | ICD-10-CM | POA: Diagnosis not present

## 2023-06-06 DIAGNOSIS — Z01419 Encounter for gynecological examination (general) (routine) without abnormal findings: Secondary | ICD-10-CM | POA: Diagnosis not present

## 2023-06-06 DIAGNOSIS — Z1151 Encounter for screening for human papillomavirus (HPV): Secondary | ICD-10-CM | POA: Diagnosis not present

## 2023-06-06 DIAGNOSIS — R5383 Other fatigue: Secondary | ICD-10-CM | POA: Diagnosis not present

## 2023-06-08 ENCOUNTER — Ambulatory Visit
Admission: RE | Admit: 2023-06-08 | Discharge: 2023-06-08 | Disposition: A | Discharge status: Home / Self Care | Payer: Federal, State, Local not specified - PPO | Source: Ambulatory Visit | Attending: Family Medicine | Admitting: Family Medicine

## 2023-06-08 DIAGNOSIS — R928 Other abnormal and inconclusive findings on diagnostic imaging of breast: Secondary | ICD-10-CM | POA: Diagnosis not present

## 2023-06-08 DIAGNOSIS — N6489 Other specified disorders of breast: Secondary | ICD-10-CM

## 2023-06-14 DIAGNOSIS — F411 Generalized anxiety disorder: Secondary | ICD-10-CM | POA: Diagnosis not present

## 2023-08-18 DIAGNOSIS — Z23 Encounter for immunization: Secondary | ICD-10-CM | POA: Diagnosis not present

## 2023-08-18 DIAGNOSIS — E559 Vitamin D deficiency, unspecified: Secondary | ICD-10-CM | POA: Diagnosis not present

## 2023-08-18 DIAGNOSIS — Z0000 Encounter for general adult medical examination without abnormal findings: Secondary | ICD-10-CM | POA: Diagnosis not present

## 2023-08-18 DIAGNOSIS — Z79899 Other long term (current) drug therapy: Secondary | ICD-10-CM | POA: Diagnosis not present

## 2023-08-18 DIAGNOSIS — E7800 Pure hypercholesterolemia, unspecified: Secondary | ICD-10-CM | POA: Diagnosis not present

## 2023-09-04 DIAGNOSIS — R293 Abnormal posture: Secondary | ICD-10-CM | POA: Diagnosis not present

## 2023-09-04 DIAGNOSIS — Z8262 Family history of osteoporosis: Secondary | ICD-10-CM | POA: Diagnosis not present

## 2023-09-04 DIAGNOSIS — M6281 Muscle weakness (generalized): Secondary | ICD-10-CM | POA: Diagnosis not present

## 2023-09-29 ENCOUNTER — Telehealth: Payer: Self-pay | Admitting: Neurology

## 2023-09-29 MED ORDER — SUMATRIPTAN SUCCINATE 50 MG PO TABS: ORAL_TABLET | ORAL | 6 refills | Status: DC

## 2023-09-29 NOTE — Telephone Encounter (Signed)
 1. Which medications need refilled? (List name and dosage, if known) sumatriptan - pt scheduled follow up appt  2. Which pharmacy/location is medication to be sent to? (include street and city if Insurance claims handler) [facility] on [address] and [address]

## 2023-09-29 NOTE — Telephone Encounter (Signed)
 Patient called and LM with AN. She needs a refill on her migraine medicine. She has been having migraines more often. She needs an appointment made, I called and lm. She has been trying OTC medicine but it isnt helping. She doesn't remember what medicine was used

## 2023-09-29 NOTE — Telephone Encounter (Signed)
 Rx sent for sumatriptan, thanks

## 2023-09-29 NOTE — Telephone Encounter (Signed)
 Pt called an informed that RX has been called in for her

## 2023-10-04 DIAGNOSIS — F439 Reaction to severe stress, unspecified: Secondary | ICD-10-CM | POA: Diagnosis not present

## 2023-10-04 DIAGNOSIS — L219 Seborrheic dermatitis, unspecified: Secondary | ICD-10-CM | POA: Diagnosis not present

## 2023-10-04 DIAGNOSIS — G43809 Other migraine, not intractable, without status migrainosus: Secondary | ICD-10-CM | POA: Diagnosis not present

## 2023-10-05 ENCOUNTER — Other Ambulatory Visit: Payer: Self-pay | Admitting: Family Medicine

## 2023-10-05 DIAGNOSIS — G43809 Other migraine, not intractable, without status migrainosus: Secondary | ICD-10-CM

## 2023-10-10 DIAGNOSIS — N926 Irregular menstruation, unspecified: Secondary | ICD-10-CM | POA: Diagnosis not present

## 2023-10-10 DIAGNOSIS — N840 Polyp of corpus uteri: Secondary | ICD-10-CM | POA: Diagnosis not present

## 2023-10-10 DIAGNOSIS — N939 Abnormal uterine and vaginal bleeding, unspecified: Secondary | ICD-10-CM | POA: Diagnosis not present

## 2023-10-10 DIAGNOSIS — Z3202 Encounter for pregnancy test, result negative: Secondary | ICD-10-CM | POA: Diagnosis not present

## 2023-10-10 DIAGNOSIS — G43909 Migraine, unspecified, not intractable, without status migrainosus: Secondary | ICD-10-CM | POA: Diagnosis not present

## 2023-10-19 ENCOUNTER — Ambulatory Visit
Admission: RE | Admit: 2023-10-19 | Discharge: 2023-10-19 | Disposition: A | Discharge status: Home / Self Care | Payer: Federal, State, Local not specified - PPO | Source: Ambulatory Visit | Attending: Family Medicine | Admitting: Family Medicine

## 2023-10-19 DIAGNOSIS — R42 Dizziness and giddiness: Secondary | ICD-10-CM | POA: Diagnosis not present

## 2023-10-19 DIAGNOSIS — G43809 Other migraine, not intractable, without status migrainosus: Secondary | ICD-10-CM

## 2023-10-19 MED ORDER — GADOPICLENOL 0.5 MMOL/ML IV SOLN
6.0000 mL | Freq: Once | INTRAVENOUS | Status: AC | PRN
  Administered 2023-10-19: 6 mL via INTRAVENOUS

## 2023-11-27 ENCOUNTER — Ambulatory Visit: Payer: Federal, State, Local not specified - PPO | Admitting: Neurology

## 2023-11-28 ENCOUNTER — Ambulatory Visit: Payer: Federal, State, Local not specified - PPO | Admitting: Neurology

## 2024-02-23 ENCOUNTER — Telehealth: Payer: Self-pay | Admitting: Neurology

## 2024-02-23 NOTE — Telephone Encounter (Signed)
 Pt. Needs refill of SUMATRIPTAN , please call when done

## 2024-02-26 MED ORDER — SUMATRIPTAN SUCCINATE 50 MG PO TABS: ORAL_TABLET | ORAL | 0 refills | Status: DC

## 2024-02-26 NOTE — Telephone Encounter (Signed)
 Pt called no answer left a voice mail that RX was sent in

## 2024-02-26 NOTE — Addendum Note (Signed)
 Addended by: Erica Hau on: 02/26/2024 12:04 PM   Modules accepted: Orders

## 2024-02-26 NOTE — Telephone Encounter (Signed)
Ok to send, thanks

## 2024-04-24 ENCOUNTER — Ambulatory Visit (INDEPENDENT_AMBULATORY_CARE_PROVIDER_SITE_OTHER): Payer: Federal, State, Local not specified - PPO | Admitting: Neurology

## 2024-04-24 ENCOUNTER — Encounter: Payer: Self-pay | Admitting: Neurology

## 2024-04-24 VITALS — BP 104/63 | HR 99 | Ht 65.0 in | Wt 139.0 lb

## 2024-04-24 DIAGNOSIS — G43009 Migraine without aura, not intractable, without status migrainosus: Secondary | ICD-10-CM

## 2024-04-24 MED ORDER — NORTRIPTYLINE HCL 10 MG PO CAPS: 10.0000 mg | ORAL_CAPSULE | Freq: Every day | ORAL | 6 refills | Status: DC

## 2024-04-24 MED ORDER — SUMATRIPTAN SUCCINATE 50 MG PO TABS: ORAL_TABLET | ORAL | 0 refills | Status: AC

## 2024-04-24 NOTE — Progress Notes (Signed)
 NEUROLOGY FOLLOW UP OFFICE NOTE  Courtney Dudley [id number] May 16, 1981  HISTORY OF PRESENT ILLNESS: I had the pleasure of seeing Courtney Dudley in follow-up in the neurology clinic on 04/24/2024.  The patient was last seen in 08/2022. She initially presented for excessive tearing/light sensitivity. MRI brain in 2023 normal. She was seen by Neuro-ophthalmology with no neurological cause for her symptoms. She has a history of migraines and has been on prn sumatriptan , however presents for worsening migraines. She had a repeat brain MRI with and without contrast 09/2023 which was normal. She recalls having a bad episode in 2017 when she had dizziness/imbalance followed by a migraine. She saw a migraine specialist at that time. She reports that every time she feels drowsy, she gets a panic attack about recurrence of episode in 2017. Migraines continue to have a catamenial component, occurring before and after her period. Topiramate  did not help. Her gynecologist started her on continuous OCP but she felt bloated all the time and stopped it after a month, too early to see if it would help with her migraines. The sumatriptan  does help quickly but she is limited by how much she can take in a week. She alternates it with Excedrin 2 tablets, and if migraine is intolerable still, she takes the sumatriptan . She reports that when she was traveling to the [place] and [place], there was no excessive tearing, but once she got back to [place], her eyes started burning and have the tearing again. She has also noticed a lot of hair loss.    History on Initial Assessment 01/11/2022: This is a very pleasant 43 year old right-handed woman with no significant past medical history presenting for evaluation of excessive tearing/light sensitivity. Records were reviewed. In April 2021, she started having vertigo with a spinning sensation and imbalance. She also had headaches and was treated for BPPV which resolved after vestibular  therapy. She then started having continued mild imbalance and dizziness affecting her home/work tasks and saw ENT specialist Dr. Karis who referred her for another course of vestibular therapy. She reported suboccipital pressure but no neck stiffness. Symptoms improved but she continued to report pressure/tension behind her eyes on her last physical therapy visit in 2021. She notes that a few months later, she started having recurrent episodes of excessive tearing in both eyes when she would drive home and eyes would get exposed to sunlight or in a room with fluorescent lights. This would get to a point where she would have a burning sensation and heaviness in her eyes and she can barely open them. She would close her eyes for a few minute and feel better, but there are days when eye symptoms are more painful and she would tend to have a headache. The headache is tolerable, no associated nausea/vomiting. She feels dizzy, similar to sensation of coming up from a bending position. She denies any visual obscurations, conjunctival injection, ptosis, or nasal stuffiness. She has a history of catamenial migraines that are different from the headaches she has with the tearing. Triggers include bright sunlight, often occurring when she is outdoors, but also looking at a computer screen or fluorescent lights. She has stopped using her contact lenses or eye makeup. There are no associated speech changes, confusion, loss of awareness, focal numbness/tingling/weakness, neck pain. No falls. She reports seeing 2 ophthalmologists and had tried Pataday and Restasis with no relief. There is no family history of similar symptoms. She usually gets 8 hours of sleep. She eats a gluten-free diet.  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   Laboratory Data:  TSH, SS-A, SS-B negative    PAST MEDICAL HISTORY: History reviewed. No pertinent past medical history.  MEDICATIONS: Current Outpatient Medications on File  Prior to Visit  Medication Sig Dispense Refill   meclizine  (ANTIVERT ) 25 MG tablet Take 1 tablet (25 mg total) by mouth 3 (three) times daily as needed for dizziness. 30 tablet 0   Multiple Vitamin (MULTIVITAMIN) tablet Take 1 tablet by mouth daily.     Olopatadine HCl (PATADAY) 0.2 % SOLN 1 drop into affected eye Ophthalmic Once a day     SUMAtriptan  (IMITREX ) 50 MG tablet Take 1 tablet as needed at onset of migraine. May repeat in 2 hours if headache persists or recurs. Do not take more than 3 a week 10 tablet 0   topiramate  (TOPAMAX ) 25 MG tablet TAKE 1 TABLET BY MOUTH AT BEDTIME (Patient not taking: Reported on 09/02/2022) 90 tablet 0   No current facility-administered medications on file prior to visit.    ALLERGIES: No Known Allergies  FAMILY HISTORY: No family history on file.  SOCIAL HISTORY: Social History   Socioeconomic History   Marital status: Married    Spouse name: [name]   Number of children: 3   Years of education: Not on file   Highest education level: Not on file  Occupational History   Not on file  Tobacco Use   Smoking status: Never   Smokeless tobacco: Never  Vaping Use   Vaping status: Never Used  Substance and Sexual Activity   Alcohol use: No   Drug use: No   Sexual activity: Yes  Other Topics Concern   Not on file  Social History Narrative   Right Handed   Lives in a 2 story home   Drinks caffeine    Social Drivers of Corporate investment banker Strain: Not on file  Food Insecurity: Not on file  Transportation Needs: Not on file  Physical Activity: Not on file  Stress: Not on file  Social Connections: Not on file  Intimate Partner Violence: Not on file     PHYSICAL EXAM: Vitals:   04/24/24 1501  BP: 104/63  Pulse: 99  SpO2: 98%   General: No acute distress Head:  Normocephalic/atraumatic Skin/Extremities: No rash, no edema Neurological Exam: alert and awake. No aphasia or dysarthria. Fund of knowledge is appropriate. Attention  and concentration are normal.   Cranial nerves: Pupils equal, round. Extraocular movements intact.  No facial asymmetry.  Motor: moves all extremities symmetrically at least anti-gravity x 4. Gait narrow-based and steady, no ataxia.    IMPRESSION: This is a very pleasant 43 yo RH woman with catamenial migraines, who presented for evaluation of potential neurological cause of excessive tearing/light sensitivity. Interestingly, she was asymptomatic while in [place], symptoms recurred once she returned to [facility]. She presents today for worsening catamenial migraines with dizziness. Sumatriptan  does help, however she is limited by quantity limits. We discussed starting a different migraine preventative medication, nortriptyline  10mg  at bedtime. Side effects discussed, she was advised to keep a calendar of the migraines, we may uptitrate dose as tolerated. We discussed other options aside from nortriptyline , including CGRP inhibitors and Botox. Follow-up in 3 months, call for any changes.    Thank you for allowing me to participate in her care.  Please do not hesitate to call for any questions or concerns.    Darice Shivers, M.D.   CC: Dr. Verena

## 2024-04-24 NOTE — Patient Instructions (Addendum)
 Good to see you.  Start nortriptyline  10mg : take 1 capsule every night  2. A prescription for as needed sumatriptan  was needed for migraine rescue  3. Keep a calendar of your migraines. Follow-up in 3-4 months, call for any changes

## 2024-06-11 DIAGNOSIS — L648 Other androgenic alopecia: Secondary | ICD-10-CM | POA: Diagnosis not present

## 2024-07-25 ENCOUNTER — Other Ambulatory Visit: Payer: Self-pay | Admitting: Family Medicine

## 2024-07-25 DIAGNOSIS — Z1231 Encounter for screening mammogram for malignant neoplasm of breast: Secondary | ICD-10-CM

## 2024-07-30 ENCOUNTER — Ambulatory Visit: Admitting: Neurology

## 2024-08-15 ENCOUNTER — Ambulatory Visit
Admission: RE | Admit: 2024-08-15 | Discharge: 2024-08-15 | Disposition: A | Source: Ambulatory Visit | Attending: Family Medicine | Admitting: Family Medicine

## 2024-08-15 DIAGNOSIS — Z1231 Encounter for screening mammogram for malignant neoplasm of breast: Secondary | ICD-10-CM

## 2024-08-21 DIAGNOSIS — L659 Nonscarring hair loss, unspecified: Secondary | ICD-10-CM | POA: Diagnosis not present

## 2024-08-21 DIAGNOSIS — G43809 Other migraine, not intractable, without status migrainosus: Secondary | ICD-10-CM | POA: Diagnosis not present

## 2024-08-21 DIAGNOSIS — M545 Low back pain, unspecified: Secondary | ICD-10-CM | POA: Diagnosis not present

## 2024-08-21 DIAGNOSIS — Z23 Encounter for immunization: Secondary | ICD-10-CM | POA: Diagnosis not present

## 2024-08-21 DIAGNOSIS — Z13220 Encounter for screening for lipoid disorders: Secondary | ICD-10-CM | POA: Diagnosis not present

## 2024-08-21 DIAGNOSIS — Z0000 Encounter for general adult medical examination without abnormal findings: Secondary | ICD-10-CM | POA: Diagnosis not present

## 2024-08-29 DIAGNOSIS — N951 Menopausal and female climacteric states: Secondary | ICD-10-CM | POA: Diagnosis not present

## 2024-10-07 ENCOUNTER — Telehealth: Payer: Self-pay | Admitting: Neurology

## 2024-10-07 NOTE — Telephone Encounter (Signed)
 Pt just stopped taken her nortiptyline she is asking if it is ok to take with everything on her medication list I just updated it, she is going to go today and refill it she is not  sure about giving her self a shot,

## 2024-10-07 NOTE — Telephone Encounter (Signed)
 Ok to take nortriptyline  with her meds, pls send another Rx, thanks

## 2024-10-07 NOTE — Addendum Note (Signed)
 Addended by: TAFT MOATS on: 10/07/2024 04:14 PM   Modules accepted: Orders

## 2024-10-07 NOTE — Telephone Encounter (Signed)
 Did she start the nortriptyline  started in July? If yes, did she have side effects? If no side effects, we increase the dose. If she had side effects, what side effects? We can try a monthly injection to help cut down on the headaches if nortriptyline  caused side effects.

## 2024-10-07 NOTE — Telephone Encounter (Signed)
 Pt called in this afternoon and she is experiencing real bad migraine headaches. Pt stated that she had to leave work due to migraines and she got real dizzy and she was imbalance. Pt wants to see if a prescription can be prescribe for the migraines. Thanks

## 2024-10-08 MED ORDER — NORTRIPTYLINE HCL 10 MG PO CAPS: 10.0000 mg | ORAL_CAPSULE | Freq: Every day | ORAL | 0 refills | Status: AC

## 2024-10-08 NOTE — Addendum Note (Signed)
 Addended by: TAFT MOATS on: 10/08/2024 08:11 AM   Modules accepted: Orders

## 2024-10-08 NOTE — Telephone Encounter (Signed)
 Pt called no answer her her request left a voice mail that it is Ok to take nortriptyline  with her meds, and that I sent in  another Rx for the nortriptyline  for her, if she has any questions she can call th office back if not we will se her at her appointment

## 2024-10-13 NOTE — Progress Notes (Unsigned)
 "  NEUROLOGY FOLLOW UP OFFICE NOTE  Courtney Dudley [id number] [birth year]  HISTORY OF PRESENT ILLNESS: I had the pleasure of seeing Courtney Dudley in follow-up in the neurology clinic on 10/14/2024.  The patient was last seen 5 months ago for migraines. She is alone in the office today. Records and images were personally reviewed where available.  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 She had side effects on Topiramate . She has prn Sumatriptan  which does help. She was prescribed nortriptyline  10mg  at bedtime on her last visit but ***. She contacted our office last week about having to leave work due to migraines and dizziness/imbalance. Nortriptyline  10mg  at bedtime restarted.   I had the pleasure of seeing Courtney Dudley in follow-up in the neurology clinic on 04/24/2024.  The patient was last seen in 08/2022. She initially presented for excessive tearing/light sensitivity. MRI brain in 2023 normal. She was seen by Neuro-ophthalmology with no neurological cause for her symptoms. She has a history of migraines and has been on prn sumatriptan , however presents for worsening migraines. She had a repeat brain MRI with and without contrast 09/2023 which was normal. She recalls having a bad episode in 2017 when she had dizziness/imbalance followed by a migraine. She saw a migraine specialist at that time. She reports that every time she feels drowsy, she gets a panic attack about recurrence of episode in 2017. Migraines continue to have a catamenial component, occurring before and after her period. Topiramate  did not help. Her gynecologist started her on continuous OCP but she felt bloated all the time and stopped it after a month, too early to see if it would help with her migraines. The sumatriptan  does help quickly but she is limited by how much she can take in a week. She alternates it with Excedrin 2 tablets, and if migraine is intolerable still, she takes the sumatriptan . She reports that  when she was traveling to the [place] and [place], there was no excessive tearing, but once she got back to [place], her eyes started burning and have the tearing again. She has also noticed a lot of hair loss.    History on Initial Assessment 01/11/2022: This is a very pleasant 44 year old right-handed woman with no significant past medical history presenting for evaluation of excessive tearing/light sensitivity. Records were reviewed. In April 2021, she started having vertigo with a spinning sensation and imbalance. She also had headaches and was treated for BPPV which resolved after vestibular therapy. She then started having continued mild imbalance and dizziness affecting her home/work tasks and saw ENT specialist Dr. Karis who referred her for another course of vestibular therapy. She reported suboccipital pressure but no neck stiffness. Symptoms improved but she continued to report pressure/tension behind her eyes on her last physical therapy visit in 2021. She notes that a few months later, she started having recurrent episodes of excessive tearing in both eyes when she would drive home and eyes would get exposed to sunlight or in a room with fluorescent lights. This would get to a point where she would have a burning sensation and heaviness in her eyes and she can barely open them. She would close her eyes for a few minute and feel better, but there are days when eye symptoms are more painful and she would tend to have a headache. The headache is tolerable, no associated nausea/vomiting. She feels dizzy, similar to sensation of coming up from a bending position. She denies any visual obscurations, conjunctival injection, ptosis,  or nasal stuffiness. She has a history of catamenial migraines that are different from the headaches she has with the tearing. Triggers include bright sunlight, often occurring when she is outdoors, but also looking at a computer screen or fluorescent lights. She has stopped using her  contact lenses or eye makeup. There are no associated speech changes, confusion, loss of awareness, focal numbness/tingling/weakness, neck pain. No falls. She reports seeing 2 ophthalmologists and had tried Pataday and Restasis with no relief. There is no family history of similar symptoms. She usually gets 8 hours of sleep. She eats a gluten-free diet. 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   Laboratory Data:  TSH, SS-A, SS-B negative  Prior migraine preventative medications: Topiramate   PAST MEDICAL HISTORY: No past medical history on file.  MEDICATIONS: Medications Ordered Prior to Encounter[1]  ALLERGIES: Allergies[2]  FAMILY HISTORY: Family History  Problem Relation Age of Onset   Breast cancer Neg Hx     SOCIAL HISTORY: Social History   Socioeconomic History   Marital status: Married    Spouse name: [name]   Number of children: 3   Years of education: Not on file   Highest education level: Not on file  Occupational History   Not on file  Tobacco Use   Smoking status: Never   Smokeless tobacco: Never  Vaping Use   Vaping status: Never Used  Substance and Sexual Activity   Alcohol use: No   Drug use: No   Sexual activity: Yes  Other Topics Concern   Not on file  Social History Narrative   Right Handed   Lives in a 2 story home   Drinks caffeine    Social Drivers of Health   Tobacco Use: Low Risk (04/24/2024)   Patient History    Smoking Tobacco Use: Never    Smokeless Tobacco Use: Never    Passive Exposure: Not on file  Financial Resource Strain: Not on file  Food Insecurity: Not on file  Transportation Needs: Not on file  Physical Activity: Not on file  Stress: Not on file  Social Connections: Not on file  Intimate Partner Violence: Not on file  Depression (EYV7-0): Not on file  Alcohol Screen: Not on file  Housing: Not on file  Utilities: Not on file  Health Literacy: Not on file     PHYSICAL EXAM: There were no vitals  filed for this visit. General: No acute distress Head:  Normocephalic/atraumatic Skin/Extremities: No rash, no edema Neurological Exam: alert and oriented to person, place, and time. No aphasia or dysarthria. Fund of knowledge is appropriate.  Recent and remote memory are intact.  Attention and concentration are normal.   Cranial nerves: Pupils equal, round. Extraocular movements intact with no nystagmus. Visual fields full.  No facial asymmetry.  Motor: Bulk and tone normal, muscle strength 5/5 throughout with no pronator drift.   Finger to nose testing intact.  Gait narrow-based and steady, able to tandem walk adequately.  Romberg negative.   IMPRESSION: This is a very pleasant 44 yo RH woman with catamenial migraines, who presented for evaluation of potential neurological cause of excessive tearing/light sensitivity. Interestingly, she was asymptomatic while in [place], symptoms recurred once she returned to [facility]. She presents today for worsening catamenial migraines with dizziness. Sumatriptan  does help, however she is limited by quantity limits. We discussed starting a different migraine preventative medication, nortriptyline  10mg  at bedtime. Side effects discussed, she was advised to keep a calendar of the migraines, we may  uptitrate dose as tolerated. We discussed other options aside from nortriptyline , including CGRP inhibitors and Botox. Follow-up in 3 months, call for any changes.     Thank you for allowing me to participate in *** care.  Please do not hesitate to call for any questions or concerns.  The duration of this appointment visit was *** minutes of face-to-face time with the patient.  Greater than 50% of this time was spent in counseling, explanation of diagnosis, planning of further management, and coordination of care.   Darice Shivers, M.D.   CC: ***     [1]  Current Outpatient Medications on File Prior to Visit  Medication Sig Dispense Refill   AUROVELA FE 1/20 1-20 MG-MCG  tablet Take 1 tablet by mouth daily.     meclizine  (ANTIVERT ) 25 MG tablet Take 1 tablet (25 mg total) by mouth 3 (three) times daily as needed for dizziness. 30 tablet 0   Multiple Vitamin (MULTIVITAMIN) tablet Take 1 tablet by mouth daily.     nortriptyline  (PAMELOR ) 10 MG capsule Take 1 capsule (10 mg total) by mouth at bedtime. 30 capsule 0   Olopatadine HCl (PATADAY) 0.2 % SOLN 1 drop into affected eye Ophthalmic Once a day (Patient not taking: Reported on 04/24/2024)     ondansetron  (ZOFRAN -ODT) 4 MG disintegrating tablet Take 4 mg by mouth daily as needed.     rosuvastatin (CRESTOR) 10 MG tablet Take 10 mg by mouth daily.     SUMAtriptan  (IMITREX ) 50 MG tablet Take 1 tablet as needed at onset of migraine. May repeat in 2 hours if headache persists or recurs. Do not take more than 3 a week 10 tablet 0   No current facility-administered medications on file prior to visit.  [2] No Known Allergies  "

## 2024-10-14 ENCOUNTER — Encounter: Payer: Self-pay | Admitting: Neurology

## 2024-10-14 ENCOUNTER — Ambulatory Visit (INDEPENDENT_AMBULATORY_CARE_PROVIDER_SITE_OTHER): Payer: Self-pay | Admitting: Neurology

## 2024-10-14 VITALS — BP 125/75 | HR 68 | Ht 65.0 in | Wt 149.2 lb

## 2024-10-14 DIAGNOSIS — G43009 Migraine without aura, not intractable, without status migrainosus: Secondary | ICD-10-CM

## 2024-10-14 NOTE — Patient Instructions (Signed)
 Good to see you.  Continue Nortriptyline  10mg : take 1 capsule every night. Update me in a week on how you are feeling, monitor sleep and stress levels. We can increase dose to 20mg  if needed  2. Try daily magnesium 400mg  and riboflavin (vitamin B2) 400mg  which can also help cut down on migraines  3. Proceed with Vestibular therapy, also ask if they can work on neck pain  4. If no improvement in neck pain, we can start an as needed muscle relaxant  5. Follow-up in 3 months, call for any changes

## 2024-10-15 ENCOUNTER — Encounter: Payer: Self-pay | Admitting: Neurology

## 2025-01-17 ENCOUNTER — Telehealth: Payer: Self-pay | Admitting: Neurology
